# Patient Record
Sex: Female | Born: 1985 | Race: White | Hispanic: No | Marital: Married | Smoking: Never smoker
Health system: Southern US, Community
[De-identification: ages and names within clinical notes are randomized; demographics above are authoritative.]

## PROBLEM LIST (undated history)

## (undated) DIAGNOSIS — Q369 Cleft lip, unilateral: Secondary | ICD-10-CM

## (undated) DIAGNOSIS — Z6833 Body mass index (BMI) 33.0-33.9, adult: Secondary | ICD-10-CM

## (undated) DIAGNOSIS — I10 Essential (primary) hypertension: Secondary | ICD-10-CM

## (undated) DIAGNOSIS — O020 Blighted ovum and nonhydatidiform mole: Secondary | ICD-10-CM

## (undated) HISTORY — PX: TUBAL LIGATION: SHX77

## (undated) HISTORY — DX: Body mass index (BMI) 33.0-33.9, adult: Z68.33

## (undated) HISTORY — PX: CLEFT LIP REPAIR: SUR1164

## (undated) HISTORY — PX: WISDOM TOOTH EXTRACTION: SHX21

## (undated) HISTORY — DX: Blighted ovum and nonhydatidiform mole: O02.0

---
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
## 2007-11-26 ENCOUNTER — Other Ambulatory Visit: Admission: RE | Admit: 2007-11-26 | Discharge: 2007-11-26 | Payer: Self-pay | Admitting: Obstetrics and Gynecology

## 2009-02-21 HISTORY — PX: DILATION AND CURETTAGE OF UTERUS: SHX78

## 2009-03-27 ENCOUNTER — Ambulatory Visit (HOSPITAL_COMMUNITY): Admission: RE | Admit: 2009-03-27 | Discharge: 2009-03-27 | Payer: Self-pay | Admitting: Family Medicine

## 2010-01-21 ENCOUNTER — Ambulatory Visit (HOSPITAL_COMMUNITY)
Admission: RE | Admit: 2010-01-21 | Discharge: 2010-01-21 | Payer: Self-pay | Source: Home / Self Care | Admitting: Obstetrics and Gynecology

## 2010-05-04 LAB — CBC
HCT: 38.3 % (ref 36.0–46.0)
MCH: 28.1 pg (ref 26.0–34.0)
MCHC: 34.5 g/dL (ref 30.0–36.0)
MCV: 81.5 fL (ref 78.0–100.0)
Platelets: 286 10*3/uL (ref 150–400)
RDW: 14.1 % (ref 11.5–15.5)
WBC: 11.4 10*3/uL — ABNORMAL HIGH (ref 4.0–10.5)

## 2010-05-04 LAB — PROTIME-INR: INR: 0.89 (ref 0.00–1.49)

## 2010-05-05 ENCOUNTER — Other Ambulatory Visit: Payer: Self-pay | Admitting: Obstetrics and Gynecology

## 2010-05-05 ENCOUNTER — Other Ambulatory Visit (HOSPITAL_COMMUNITY)
Admission: RE | Admit: 2010-05-05 | Discharge: 2010-05-05 | Disposition: A | Discharge status: Home / Self Care | Payer: BC Managed Care – PPO | Source: Ambulatory Visit | Attending: Obstetrics and Gynecology | Admitting: Obstetrics and Gynecology

## 2010-05-05 DIAGNOSIS — Z01419 Encounter for gynecological examination (general) (routine) without abnormal findings: Secondary | ICD-10-CM | POA: Insufficient documentation

## 2010-08-31 LAB — HEPATITIS B SURFACE ANTIGEN: Hepatitis B Surface Ag: NEGATIVE

## 2010-08-31 LAB — ABO/RH: RH Type: POSITIVE

## 2010-08-31 LAB — RUBELLA ANTIBODY, IGM: Rubella: IMMUNE

## 2010-08-31 LAB — ANTIBODY SCREEN: Antibody Screen: NEGATIVE

## 2010-09-25 ENCOUNTER — Emergency Department (HOSPITAL_COMMUNITY)
Admission: EM | Admit: 2010-09-25 | Discharge: 2010-09-25 | Disposition: A | Discharge status: Home / Self Care | Payer: BC Managed Care – PPO | Attending: Emergency Medicine | Admitting: Emergency Medicine

## 2010-09-25 ENCOUNTER — Emergency Department (HOSPITAL_COMMUNITY): Payer: BC Managed Care – PPO

## 2010-09-25 ENCOUNTER — Encounter: Payer: Self-pay | Admitting: *Deleted

## 2010-09-25 DIAGNOSIS — O99891 Other specified diseases and conditions complicating pregnancy: Secondary | ICD-10-CM | POA: Insufficient documentation

## 2010-09-25 DIAGNOSIS — O26899 Other specified pregnancy related conditions, unspecified trimester: Secondary | ICD-10-CM

## 2010-09-25 DIAGNOSIS — R109 Unspecified abdominal pain: Secondary | ICD-10-CM | POA: Insufficient documentation

## 2010-09-25 LAB — URINALYSIS, ROUTINE W REFLEX MICROSCOPIC
Hgb urine dipstick: NEGATIVE
Nitrite: NEGATIVE
Urobilinogen, UA: 0.2 mg/dL (ref 0.0–1.0)

## 2010-09-25 NOTE — ED Notes (Signed)
Pt returned from [place]. Nad. No change.

## 2010-09-25 NOTE — ED Notes (Signed)
No change in status. Awaiting [place] tech.

## 2010-09-25 NOTE — ED Notes (Addendum)
Pt being transported to [place] at this time. nad

## 2010-09-25 NOTE — ED Notes (Signed)
edp in with pt now 

## 2010-09-25 NOTE — ED Notes (Signed)
Pt c/o right sided flank pain. Pt states it feels like a kidney infection.

## 2010-09-25 NOTE — ED Provider Notes (Signed)
History     CSN: [id number] Arrival date & time: 09/25/2010 10:09 AM  Chief Complaint  Patient presents with  . Flank Pain    pt c/o right sided flank pian   HPI Comments: Patient woke from sleep this morning, then developed pain in right flank radiating into the right lower abdomen.  States she has had slight burning with urination this week.  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    Patient is a 25 y.o. female presenting with flank pain. The history is provided by the patient.  Flank Pain This is a new problem. The current episode started 3 to 5 hours ago. The problem occurs constantly. The problem has not changed since onset.Pertinent negatives include no chest pain, no abdominal pain and no headaches. The symptoms are aggravated by nothing. The symptoms are relieved by nothing. She has tried nothing for the symptoms.    History reviewed. No pertinent past medical history.  Past Surgical History  Procedure Date  . Cleft lip repair   . Wisdom tooth extraction     History reviewed. No pertinent family history.  History  Substance Use Topics  . Smoking status: Never Smoker   . Smokeless tobacco: Never Used  . Alcohol Use: No    OB History    Grav Para Term Preterm Abortions TAB SAB Ect Mult Living   2    1           Review of Systems  Constitutional: Negative for activity change and appetite change.  Respiratory: Negative for cough.   Cardiovascular: Negative for chest pain and palpitations.  Gastrointestinal: Negative for nausea, vomiting, abdominal pain, diarrhea and constipation.  Genitourinary: Positive for dysuria and flank pain. Negative for hematuria, vaginal bleeding, vaginal discharge and vaginal pain.  Neurological: Negative for headaches.    Physical Exam  BP 145/91  Pulse 134  Temp(Src) 98.6 F (37 C) (Oral)  Resp 18  Ht 5\' 5"  (1.651 m)  Wt 189 lb (85.73 kg)  BMI 31.45 kg/m2  SpO2 100%  Physical Exam  Constitutional: She is oriented to  person, place, and time. She appears well-developed and well-nourished. No distress.  HENT:  Head: Normocephalic and atraumatic.  Neck: Normal range of motion. Neck supple.  Cardiovascular: Normal rate and regular rhythm.  Exam reveals no gallop and no friction rub.   No murmur heard. Pulmonary/Chest: Effort normal and breath sounds normal. No respiratory distress. She has no wheezes. She has no rales.  Abdominal: Soft.       Mild tenderness to palpation in the right flank and rlq.  There is no rebound or guarding.  Musculoskeletal: Normal range of motion.  Neurological: She is alert and oriented to person, place, and time.  Skin: Skin is warm and dry. She is not diaphoretic.    ED Course  Procedures  MDM Ultrasound looks okay.  No cause of pain identified.  ? Round ligament pain.  Will give time, return if worsens.      Geoffery Lyons, MD 09/25/10 [phone]

## 2011-02-22 NOTE — L&D Delivery Note (Signed)
Delivery Note  Pushed well to crowning.  At 10:40 AM a viable and healthy female was delivered via Vaginal, Spontaneous Delivery (Presentation: Right Occiput Anterior).  APGAR: 9, 9; weight 7 lb 3 oz (3260 g).   No difficulty with shoulders. There was a loose nuchal cord noted but baby delivered through it. Placenta status: Intact, Spontaneous.  Cord: 3 vessels with the following complications: None.  Cord pH: n/a  Bilateral periclitoral lacerations were noted which continued to bleed. Sutured with 40 vicryl rapide to hemostasis.   Anesthesia: Epidural  Episiotomy: None Lacerations:  Suture Repair: vicryl rapide 4.0 Est. Blood Loss (mL): 200  Mom to postpartum.  Baby to nursery-stable.  Wynelle Bourgeois 04/13/2011, 11:28 AM

## 2011-04-04 LAB — STREP B DNA PROBE: GBS: NEGATIVE

## 2011-04-12 ENCOUNTER — Encounter (HOSPITAL_COMMUNITY): Payer: Self-pay

## 2011-04-12 ENCOUNTER — Inpatient Hospital Stay (HOSPITAL_COMMUNITY)
Admission: RE | Admit: 2011-04-12 | Discharge: 2011-04-15 | DRG: 372 | Disposition: A | Discharge status: Home Health | Payer: BC Managed Care – PPO | Source: Ambulatory Visit | Attending: Obstetrics & Gynecology | Admitting: Obstetrics & Gynecology

## 2011-04-12 ENCOUNTER — Other Ambulatory Visit: Payer: Self-pay | Admitting: Obstetrics and Gynecology

## 2011-04-12 DIAGNOSIS — O139 Gestational [pregnancy-induced] hypertension without significant proteinuria, unspecified trimester: Secondary | ICD-10-CM | POA: Diagnosis present

## 2011-04-12 HISTORY — DX: Essential (primary) hypertension: I10

## 2011-04-12 LAB — COMPREHENSIVE METABOLIC PANEL
AST: 16 U/L (ref 0–37)
Albumin: 2.6 g/dL — ABNORMAL LOW (ref 3.5–5.2)
BUN: 7 mg/dL (ref 6–23)
Chloride: 104 mEq/L (ref 96–112)
Creatinine, Ser: 0.96 mg/dL (ref 0.50–1.10)
Potassium: 3.8 mEq/L (ref 3.5–5.1)
Total Bilirubin: 0.2 mg/dL — ABNORMAL LOW (ref 0.3–1.2)
Total Protein: 6.2 g/dL (ref 6.0–8.3)

## 2011-04-12 LAB — CBC
HCT: 37 % (ref 36.0–46.0)
MCHC: 32.7 g/dL (ref 30.0–36.0)
MCV: 79.7 fL (ref 78.0–100.0)
Platelets: 204 10*3/uL (ref 150–400)
RDW: 13.6 % (ref 11.5–15.5)
WBC: 11.6 10*3/uL — ABNORMAL HIGH (ref 4.0–10.5)

## 2011-04-12 LAB — GC/CHLAMYDIA PROBE AMP, GENITAL: Chlamydia: NEGATIVE

## 2011-04-12 MED ORDER — LACTATED RINGERS IV SOLN
500.0000 mL | INTRAVENOUS | Status: DC | PRN
  Administered 2011-04-13: 1000 mL via INTRAVENOUS

## 2011-04-12 MED ORDER — SODIUM CHLORIDE 0.9 % IV SOLN: 250.0000 mL | INTRAVENOUS | Status: DC | PRN

## 2011-04-12 MED ORDER — ACETAMINOPHEN 325 MG PO TABS: 650.0000 mg | ORAL_TABLET | ORAL | Status: DC | PRN

## 2011-04-12 MED ORDER — CITRIC ACID-SODIUM CITRATE 334-500 MG/5ML PO SOLN: 30.0000 mL | ORAL | Status: DC | PRN

## 2011-04-12 MED ORDER — MISOPROSTOL 25 MCG QUARTER TABLET
25.0000 ug | ORAL_TABLET | ORAL | Status: DC | PRN
  Administered 2011-04-12 – 2011-04-13 (×2): 25 ug via VAGINAL
  Filled 2011-04-12 (×2): qty 0.25

## 2011-04-12 MED ORDER — FLEET ENEMA 7-19 GM/118ML RE ENEM: 1.0000 | ENEMA | RECTAL | Status: DC | PRN

## 2011-04-12 MED ORDER — ONDANSETRON HCL 4 MG/2ML IJ SOLN: 4.0000 mg | Freq: Four times a day (QID) | INTRAMUSCULAR | Status: DC | PRN

## 2011-04-12 MED ORDER — LIDOCAINE HCL (PF) 1 % IJ SOLN
30.0000 mL | INTRAMUSCULAR | Status: DC | PRN
  Filled 2011-04-12: qty 30

## 2011-04-12 MED ORDER — IBUPROFEN 600 MG PO TABS: 600.0000 mg | ORAL_TABLET | Freq: Four times a day (QID) | ORAL | Status: DC | PRN

## 2011-04-12 MED ORDER — OXYCODONE-ACETAMINOPHEN 5-325 MG PO TABS: 1.0000 | ORAL_TABLET | ORAL | Status: DC | PRN

## 2011-04-12 MED ORDER — TERBUTALINE SULFATE 1 MG/ML IJ SOLN: 0.2500 mg | Freq: Once | INTRAMUSCULAR | Status: AC | PRN

## 2011-04-12 MED ORDER — SODIUM CHLORIDE 0.9 % IJ SOLN: 3.0000 mL | Freq: Two times a day (BID) | INTRAMUSCULAR | Status: DC

## 2011-04-12 MED ORDER — SODIUM CHLORIDE 0.9 % IJ SOLN
3.0000 mL | INTRAMUSCULAR | Status: DC | PRN
  Administered 2011-04-12: 3 mL via INTRAVENOUS

## 2011-04-12 NOTE — H&P (Signed)
  Destiny Pena is a 26 y.o. female presenting for induction of labor. She has been seen in the office this weight 37 weeks 5 days. On visit yesterday the patient had blood pressures 156/104, with 3 + reflexes, with PIH labs notable for normal liver function tests, platelets about 300 K., and 24 urine collection begun, returning today with 230 mg per day proteinuria, with blood pressures after 24 hours of bed rest remaining at 134/104 rechecked at 140/106 with reflexes unchanged at 3+ and patient reporting mild dull headache today scotoma right upper quadrant pain or bleeding she is admitted for induction of labor. Excised prenatal course followed at [facility] since July 10 at 5 weeks 5 days consensus [facility] 04/28/2011 with corresponding first trimester ultrasound x2. Prenatal labs include blood type O-positive antibody screen negative rubella immunity present hepatitis HIV RPR, GC, Chlamydia all negative integrated testing was performed and first trimester and was normal glucose tolerance test normal at 2 hour testing group B strep is negative as repeat GC and Chlamydia tests. He has attended childbirth classes breast-feed has a female infant desire circumcision as outpatient with undecided contraception plans History OB History    Grav Para Term Preterm Abortions TAB SAB Ect Mult Living   2    1          No past medical history on file. Past Surgical History  Procedure Date  . Cleft lip repair   . Wisdom tooth extraction    Family History: family history is not on file. Social History:  reports that she has never smoked. She has never used smokeless tobacco. She reports that she does not drink alcohol or use illicit drugs.  ROS positive for dull headache, negative for right upper quadrant pain scotoma   There were no vitals taken for this visit. vital signs blood pressure 134/104, recheck 1 4106 in the office today weight 201.8 pounds ExamPhysical Examination: General appearance -  alert, well appearing, and in no distress, oriented to person, place, and time and acyanotic, in no respiratory distress Mental status - alert, oriented to person, place, and time, anxious Mouth - mucous membranes moist, pharynx normal without lesions and dental hygiene good Chest - clear to auscultation, no wheezes, rales or rhonchi, symmetric air entry Heart - normal rate and regular rhythm Abdomen -  Gravid uterus 37 cm fundal height vertex presentation fetal heart tones 140 in the left lower quadrant Pelvic - normal external genitalia, vulva, vagina, cervix, uterus and adnexa, VULVA: normal appearing vulva with no masses, tenderness or lesions,            VAGINA: Secretions normal, CERVIX: normal appearing cervix without discharge or lesio cervix 1-2 cm dilation 25% effaced -2 station soft tissue vertex presentation Extremities - 2+ edema to pretibial area 3+ reflexes at knee,no clonus  Physical Exam  Prenatal labs: ABO, Rh:   O. positive Antibody:   negative Rubella:   immune RPR:   nonreactive HBsAg:   nonreactive HIV:   negative GBS:   negative Assessment/Plan: Gestational hypertension at 37 weeks 5 days admitted for induction of labor    Case presented to Dr. Macon Large. Admission scheduled for 7:30 PM 04/12/2011 May be a candidate for Cytotec ripening before Foley bulb,/ Pitocin induction   Samie Barclift V 04/12/2011, 7:16 PM

## 2011-04-13 ENCOUNTER — Encounter (HOSPITAL_COMMUNITY): Payer: Self-pay | Admitting: Anesthesiology

## 2011-04-13 ENCOUNTER — Inpatient Hospital Stay (HOSPITAL_COMMUNITY): Payer: BC Managed Care – PPO | Admitting: Anesthesiology

## 2011-04-13 ENCOUNTER — Encounter (HOSPITAL_COMMUNITY): Payer: Self-pay

## 2011-04-13 DIAGNOSIS — O139 Gestational [pregnancy-induced] hypertension without significant proteinuria, unspecified trimester: Secondary | ICD-10-CM

## 2011-04-13 LAB — RPR: RPR Ser Ql: NONREACTIVE

## 2011-04-13 LAB — CBC
MCH: 26 pg (ref 26.0–34.0)
MCV: 80.1 fL (ref 78.0–100.0)
Platelets: 191 10*3/uL (ref 150–400)
Platelets: 204 10*3/uL (ref 150–400)
RBC: 4.73 MIL/uL (ref 3.87–5.11)
RDW: 13.7 % (ref 11.5–15.5)
RDW: 13.7 % (ref 11.5–15.5)
WBC: 22.4 10*3/uL — ABNORMAL HIGH (ref 4.0–10.5)

## 2011-04-13 MED ORDER — EPHEDRINE 5 MG/ML INJ: 10.0000 mg | INTRAVENOUS | Status: DC | PRN

## 2011-04-13 MED ORDER — ONDANSETRON HCL 4 MG/2ML IJ SOLN: 4.0000 mg | Freq: Four times a day (QID) | INTRAMUSCULAR | Status: DC | PRN

## 2011-04-13 MED ORDER — FLEET ENEMA 7-19 GM/118ML RE ENEM: 1.0000 | ENEMA | RECTAL | Status: DC | PRN

## 2011-04-13 MED ORDER — LANOLIN HYDROUS EX OINT: TOPICAL_OINTMENT | CUTANEOUS | Status: DC | PRN

## 2011-04-13 MED ORDER — BENZOCAINE-MENTHOL 20-0.5 % EX AERO
INHALATION_SPRAY | CUTANEOUS | Status: AC
  Filled 2011-04-13: qty 56

## 2011-04-13 MED ORDER — IBUPROFEN 600 MG PO TABS: 600.0000 mg | ORAL_TABLET | Freq: Four times a day (QID) | ORAL | Status: DC | PRN

## 2011-04-13 MED ORDER — OXYCODONE-ACETAMINOPHEN 5-325 MG PO TABS: 1.0000 | ORAL_TABLET | ORAL | Status: DC | PRN

## 2011-04-13 MED ORDER — FENTANYL 2.5 MCG/ML BUPIVACAINE 1/10 % EPIDURAL INFUSION (WH - ANES)
14.0000 mL/h | INTRAMUSCULAR | Status: DC
  Administered 2011-04-13 (×2): 14 mL/h via EPIDURAL
  Filled 2011-04-13 (×2): qty 60

## 2011-04-13 MED ORDER — DIPHENHYDRAMINE HCL 50 MG/ML IJ SOLN: 12.5000 mg | INTRAMUSCULAR | Status: DC | PRN

## 2011-04-13 MED ORDER — WITCH HAZEL-GLYCERIN EX PADS
1.0000 "application " | MEDICATED_PAD | CUTANEOUS | Status: DC | PRN
  Administered 2011-04-13: 1 via TOPICAL

## 2011-04-13 MED ORDER — SIMETHICONE 80 MG PO CHEW: 80.0000 mg | CHEWABLE_TABLET | ORAL | Status: DC | PRN

## 2011-04-13 MED ORDER — LIDOCAINE HCL (PF) 1 % IJ SOLN
INTRAMUSCULAR | Status: DC | PRN
  Administered 2011-04-13 (×2): 5 mL

## 2011-04-13 MED ORDER — BENZOCAINE-MENTHOL 20-0.5 % EX AERO
1.0000 "application " | INHALATION_SPRAY | CUTANEOUS | Status: DC | PRN
  Administered 2011-04-13: 1 via TOPICAL

## 2011-04-13 MED ORDER — CITRIC ACID-SODIUM CITRATE 334-500 MG/5ML PO SOLN: 30.0000 mL | ORAL | Status: DC | PRN

## 2011-04-13 MED ORDER — IBUPROFEN 600 MG PO TABS
600.0000 mg | ORAL_TABLET | Freq: Four times a day (QID) | ORAL | Status: DC
  Administered 2011-04-13 – 2011-04-15 (×7): 600 mg via ORAL
  Filled 2011-04-13 (×7): qty 1

## 2011-04-13 MED ORDER — PHENYLEPHRINE 40 MCG/ML (10ML) SYRINGE FOR IV PUSH (FOR BLOOD PRESSURE SUPPORT): 80.0000 ug | PREFILLED_SYRINGE | INTRAVENOUS | Status: DC | PRN

## 2011-04-13 MED ORDER — ONDANSETRON HCL 4 MG/2ML IJ SOLN: 4.0000 mg | INTRAMUSCULAR | Status: DC | PRN

## 2011-04-13 MED ORDER — TETANUS-DIPHTH-ACELL PERTUSSIS 5-2.5-18.5 LF-MCG/0.5 IM SUSP
0.5000 mL | Freq: Once | INTRAMUSCULAR | Status: AC
  Administered 2011-04-14: 0.5 mL via INTRAMUSCULAR
  Filled 2011-04-13: qty 0.5

## 2011-04-13 MED ORDER — EPHEDRINE 5 MG/ML INJ
10.0000 mg | INTRAVENOUS | Status: DC | PRN
  Filled 2011-04-13: qty 4

## 2011-04-13 MED ORDER — ZOLPIDEM TARTRATE 5 MG PO TABS: 5.0000 mg | ORAL_TABLET | Freq: Every evening | ORAL | Status: DC | PRN

## 2011-04-13 MED ORDER — SENNOSIDES-DOCUSATE SODIUM 8.6-50 MG PO TABS
2.0000 | ORAL_TABLET | Freq: Every day | ORAL | Status: DC
  Administered 2011-04-13: 2 via ORAL

## 2011-04-13 MED ORDER — LACTATED RINGERS IV SOLN
500.0000 mL | Freq: Once | INTRAVENOUS | Status: AC
  Administered 2011-04-13: 1000 mL via INTRAVENOUS

## 2011-04-13 MED ORDER — OXYTOCIN 20 UNITS IN LACTATED RINGERS INFUSION - SIMPLE: 125.0000 mL/h | Freq: Once | INTRAVENOUS | Status: DC

## 2011-04-13 MED ORDER — LIDOCAINE HCL (PF) 1 % IJ SOLN: 30.0000 mL | INTRAMUSCULAR | Status: DC | PRN

## 2011-04-13 MED ORDER — PHENYLEPHRINE 40 MCG/ML (10ML) SYRINGE FOR IV PUSH (FOR BLOOD PRESSURE SUPPORT)
80.0000 ug | PREFILLED_SYRINGE | INTRAVENOUS | Status: DC | PRN
  Filled 2011-04-13: qty 5

## 2011-04-13 MED ORDER — ONDANSETRON HCL 4 MG PO TABS: 4.0000 mg | ORAL_TABLET | ORAL | Status: DC | PRN

## 2011-04-13 MED ORDER — LACTATED RINGERS IV SOLN: 500.0000 mL | INTRAVENOUS | Status: DC | PRN

## 2011-04-13 MED ORDER — DIPHENHYDRAMINE HCL 25 MG PO CAPS: 25.0000 mg | ORAL_CAPSULE | Freq: Four times a day (QID) | ORAL | Status: DC | PRN

## 2011-04-13 MED ORDER — OXYTOCIN BOLUS FROM INFUSION
500.0000 mL | Freq: Once | INTRAVENOUS | Status: DC
  Administered 2011-04-13: 500 mL via INTRAVENOUS
  Filled 2011-04-13: qty 1000
  Filled 2011-04-13: qty 500

## 2011-04-13 MED ORDER — NALBUPHINE SYRINGE 5 MG/0.5 ML
10.0000 mg | INJECTION | INTRAMUSCULAR | Status: DC | PRN
  Administered 2011-04-13: 10 mg via INTRAVENOUS
  Filled 2011-04-13: qty 0.5
  Filled 2011-04-13: qty 1

## 2011-04-13 MED ORDER — PRENATAL MULTIVITAMIN CH
1.0000 | ORAL_TABLET | Freq: Every day | ORAL | Status: DC
  Administered 2011-04-14 – 2011-04-15 (×2): 1 via ORAL
  Filled 2011-04-13 (×2): qty 1

## 2011-04-13 MED ORDER — LACTATED RINGERS IV SOLN
INTRAVENOUS | Status: DC
  Administered 2011-04-13: 08:00:00 via INTRAVENOUS

## 2011-04-13 MED ORDER — ACETAMINOPHEN 325 MG PO TABS: 650.0000 mg | ORAL_TABLET | ORAL | Status: DC | PRN

## 2011-04-13 MED ORDER — NALBUPHINE SYRINGE 5 MG/0.5 ML: 10.0000 mg | INJECTION | INTRAMUSCULAR | Status: DC | PRN

## 2011-04-13 MED ORDER — DIBUCAINE 1 % RE OINT: 1.0000 "application " | TOPICAL_OINTMENT | RECTAL | Status: DC | PRN

## 2011-04-13 NOTE — Progress Notes (Signed)
CNM at bedside, reviewed strip.

## 2011-04-13 NOTE — Progress Notes (Signed)
Late Entry for 0830:  FHR reassuring. UCs every 2-3 minutes. Cervix complete, patient pushing with good effort. Comfortable with epidural.

## 2011-04-13 NOTE — Progress Notes (Signed)
Destiny Pena is a 26 y.o. G2P0010 at [redacted]w[redacted]d   Subjective: Receiving epid now; s/p cytotec x 2 doses  Objective: BP 139/98  Pulse 68  Temp(Src) 97.5 F (36.4 C) (Oral)  Resp 18  Ht 5\' 4"  (1.626 m)  Wt 91.173 kg (201 lb)  BMI 34.50 kg/m2  LMP 07/22/2010      FHT:  FHR: 120 bpm, variability: moderate,  accelerations:  Present,  decelerations:  Absent UC:   regular, every 2-3 minutes SVE:   Dilation: 6 Effacement (%): 100 Station: +1 Exam by:: Ace Gins, RN  Labs: Lab Results  Component Value Date   WBC 14.8* 04/13/2011   HGB 12.3 04/13/2011   HCT 37.9 04/13/2011   MCV 80.1 04/13/2011   PLT 191 04/13/2011    Assessment / Plan: Active labor GHTN- stable for now  Will reeval cx in approx 2 hours or sooner prn; no Pitocin needed for now    Cam Hai 04/13/2011, 6:33 AM

## 2011-04-13 NOTE — Anesthesia Procedure Notes (Signed)
Epidural Patient location during procedure: OB Start time: 04/13/2011 6:29 AM  Staffing Anesthesiologist: Brayton Caves R Performed by: anesthesiologist   Preanesthetic Checklist Completed: patient identified, site marked, surgical consent, pre-op evaluation, timeout performed, IV checked, risks and benefits discussed and monitors and equipment checked  Epidural Patient position: sitting Prep: site prepped and draped and DuraPrep Patient monitoring: continuous pulse ox and blood pressure Approach: midline Injection technique: LOR air and LOR saline  Needle:  Needle type: Tuohy  Needle gauge: 17 G Needle length: 9 cm Needle insertion depth: 6 cm Catheter type: closed end flexible Catheter size: 19 Gauge Catheter at skin depth: 11 cm Test dose: negative  Assessment Events: blood not aspirated, injection not painful, no injection resistance, negative IV test and no paresthesia  Additional Notes Patient identified.  Risk benefits discussed including failed block, incomplete pain control, headache, nerve damage, paralysis, blood pressure changes, nausea, vomiting, reactions to medication both toxic or allergic, and postpartum back pain.  Patient expressed understanding and wished to proceed.  All questions were answered.  Sterile technique used throughout procedure and epidural site dressed with sterile barrier dressing. No paresthesia or other complications noted.The patient did not experience any signs of intravascular injection such as tinnitus or metallic taste in mouth nor signs of intrathecal spread such as rapid motor block. Please see nursing notes for vital signs.

## 2011-04-13 NOTE — Anesthesia Preprocedure Evaluation (Signed)
Anesthesia Evaluation  Patient identified by MRN, date of birth, ID band Patient awake    Reviewed: Allergy & Precautions, H&P , Patient's Chart, lab work & pertinent test results  Airway Mallampati: II TM Distance: >3 FB Neck ROM: full    Dental No notable dental hx.    Pulmonary neg pulmonary ROS,  clear to auscultation  Pulmonary exam normal       Cardiovascular hypertension, neg cardio ROS regular Normal    Neuro/Psych Negative Neurological ROS  Negative Psych ROS   GI/Hepatic negative GI ROS, Neg liver ROS,   Endo/Other  Negative Endocrine ROS  Renal/GU negative Renal ROS     Musculoskeletal   Abdominal   Peds  Hematology negative hematology ROS (+)   Anesthesia Other Findings   Reproductive/Obstetrics (+) Pregnancy                           Anesthesia Physical Anesthesia Plan  ASA: III  Anesthesia Plan: Epidural   Post-op Pain Management:    Induction:   Airway Management Planned:   Additional Equipment:   Intra-op Plan:   Post-operative Plan:   Informed Consent: I have reviewed the patients History and Physical, chart, labs and discussed the procedure including the risks, benefits and alternatives for the proposed anesthesia with the patient or authorized representative who has indicated his/her understanding and acceptance.     Plan Discussed with:   Anesthesia Plan Comments:         Anesthesia Quick Evaluation  

## 2011-04-14 MED ORDER — HYDROCHLOROTHIAZIDE 25 MG PO TABS
25.0000 mg | ORAL_TABLET | Freq: Every day | ORAL | Status: DC
  Administered 2011-04-14 – 2011-04-15 (×2): 25 mg via ORAL
  Filled 2011-04-14 (×2): qty 1

## 2011-04-14 NOTE — Anesthesia Postprocedure Evaluation (Signed)
Anesthesia Post Note  Patient: Destiny Pena  Procedure(s) Performed: * No procedures listed *  Anesthesia type: Spinal  Patient location: Mother/Baby  Post pain: Pain level controlled  Post assessment: Post-op Vital signs reviewed  Last Vitals:  Filed Vitals:   04/14/11 1608  BP: 134/90  Pulse: 92  Temp:   Resp: 18    Post vital signs: Reviewed  Level of consciousness: awake  Complications: No apparent anesthesia complications

## 2011-04-14 NOTE — Progress Notes (Signed)
Post Partum Day 1 Subjective: no complaints, up ad lib, voiding and tolerating PO, small lochia, plans to breastfeed, oral progesterone-only contraceptive  Objective: Blood pressure 128/79, pulse 88, temperature 97.6 F (36.4 C), temperature source Oral, resp. rate 18, height 5\' 4"  (1.626 m), weight 201 lb (91.173 kg), last menstrual period 07/22/2010, SpO2 97.00%, unknown if currently breastfeeding.  Breastfeeding well  Physical Exam:  General: alert, cooperative and no distress Lochia:normal flow Chest: CTAB Heart: RRR no m/r/g Abdomen: +BS, soft, nontender,  Uterine Fundus: firm DVT Evaluation: No evidence of DVT seen on physical exam. Extremities: 1+ edema   Basename 04/13/11 1230 04/13/11 0555  HGB 11.6* 12.3  HCT 35.8* 37.9    Assessment/Plan: Plan for discharge tomorrow, Breastfeeding and Lactation consult   LOS: 2 days   Pena,Destiny Dowler 04/14/2011, 7:33 AM

## 2011-04-14 NOTE — Progress Notes (Signed)
[facility] CNM Called with,  Patients V/s and  High B/P readings. Patient denies any symptoms. See Assessment. CNM will round on patient this afternoon and would like hourly B/P taken until she sees patient today 04/13/2010 1446.

## 2011-04-15 MED ORDER — IBUPROFEN 600 MG PO TABS: 600.0000 mg | ORAL_TABLET | Freq: Four times a day (QID) | ORAL | Status: AC

## 2011-04-15 NOTE — Discharge Summary (Signed)
Obstetric Discharge Summary Reason for Admission: onset of labor Prenatal Procedures: none Intrapartum Procedures: spontaneous vaginal delivery Postpartum Procedures: none Complications-Operative and Postpartum: 1st degree periclitoral laceration Hemoglobin  Date Value Range Status  04/13/2011 11.6* 12.0-15.0 (g/dL) Final     HCT  Date Value Range Status  04/13/2011 35.8* 36.0-46.0 (%) Final    Discharge Diagnoses: Term Pregnancy-delivered  Discharge Information: Date: 04/15/2011 Activity: pelvic rest Diet: routine Medications: PNV and Ibuprofen Condition: stable Instructions: refer to practice specific booklet Discharge to: home Follow-up Information    Follow up with [facility] in 6 weeks.         Newborn Data: Live born female  Birth Weight: 7 lb 3 oz (3260 g) APGAR: 9, 9  Home with mother.  [name] 04/15/2011, 11:26 AM

## 2011-04-15 NOTE — Progress Notes (Signed)
Post Partum Day #2 Subjective: up ad lib, voiding, tolerating PO and appropriate lochia. Patient states she has had some difficulty controlling her urine. She does not feel the urge to go and has had some incontinence.  Also, BP elevated overnight. Highest reading 160/94. Patient is on HCTZ. Breast feeding is going well. Baby boy scheduled for circ today. She will follow up with [facility]. Interested in [facility] for contraception.  Objective: Blood pressure 140/97, pulse 93, temperature 98.1 F (36.7 C), temperature source Oral, resp. rate 18, height 5\' 4"  (1.626 m), weight 91.173 kg (201 lb), last menstrual period 07/22/2010, SpO2 98.00%, unknown if currently breastfeeding.  Physical Exam:  General: alert, cooperative and no distress Lochia: appropriate Uterine Fundus: firm DVT Evaluation: No evidence of DVT seen on physical exam.   Basename 04/13/11 1230 04/13/11 0555  HGB 11.6* 12.3  HCT 35.8* 37.9    Assessment/Plan: Discharge home, Breastfeeding, Circumcision prior to discharge and Contraception OCP   LOS: 3 days   Destiny Pena 04/15/2011, 6:56 AM

## 2011-04-15 NOTE — Discharge Instructions (Signed)
Vaginal Delivery °Care After °· Change your pad on each trip to the bathroom.  °· Wipe gently with toilet paper during your hospital stay. Always wipe from front to back. A spray bottle with warm tap water could also be used or a towelette if available.  °· Place your soiled pad and toilet paper in a bathroom wastebasket with a plastic bag liner.  °· During your hospital stay, save any clots. If you pass a clot while on the toilet, do not flush it. Also, if your vaginal flow seems excessive to you, notify nursing personnel.  °· The first time you get out of bed after delivery, wait for assistance from a nurse. Do not get up alone at any time if you feel weak or dizzy.  °· Bend and extend your ankles forcefully so that you feel the calves of your legs get hard. Do this 6 times every hour when you are in bed and awake.  °· Do not sit with one foot under you, dangle your legs over the edge of the bed, or maintain a position that hinders the circulation in your legs.  °· Many women experience after pains for 2 to 3 days after delivery. These after pains are mild uterine contractions. Ask the nurse for a pain medication if you need something for this. Sometimes breastfeeding stimulates after pains; if you find this to be true, ask for the medication ½ - ¾ hour before the next feeding.  °· For you and your infant's protection, do not go beyond the door(s) of the obstetric unit. Do not carry your baby in your arms in the hallway. When taking your baby to and from your room, put your baby in the bassinet and push the bassinet.  °· Mothers may have their babies in their room as much as they desire.  °Document Released: 02/05/2000 Document Revised: 10/20/2010 Document Reviewed: 01/05/2007 °ExitCare® Patient Information ©2012 [employer]. °

## 2011-04-15 NOTE — Progress Notes (Signed)
Patient ID: Destiny Pena, female   DOB: May 19, 1985, 26 y.o.   MRN: [id number]  I discussed with the patient regarding the risks and benefits of having a circumcision done for her newborn son, including poor cosmetic result, injury to glans or urethra, reaction to medication, bleeding.  Questions answered.  Consent signed and on the chart.  Candelaria Celeste JEHIEL 04/15/2011 9:59 AM

## 2011-04-18 ENCOUNTER — Encounter (HOSPITAL_COMMUNITY): Payer: Self-pay

## 2011-04-28 ENCOUNTER — Inpatient Hospital Stay (HOSPITAL_COMMUNITY): Admission: AD | Admit: 2011-04-28 | Payer: Self-pay | Source: Ambulatory Visit | Admitting: Obstetrics and Gynecology

## 2012-04-09 ENCOUNTER — Other Ambulatory Visit: Payer: Self-pay | Admitting: Obstetrics and Gynecology

## 2012-04-09 ENCOUNTER — Encounter: Payer: Self-pay | Admitting: Obstetrics and Gynecology

## 2012-04-09 ENCOUNTER — Ambulatory Visit: Payer: Self-pay | Admitting: Obstetrics and Gynecology

## 2012-04-09 VITALS — BP 124/88 | HR 86 | Ht 64.0 in | Wt 183.0 lb

## 2012-04-09 DIAGNOSIS — G43829 Menstrual migraine, not intractable, without status migrainosus: Secondary | ICD-10-CM

## 2012-04-09 DIAGNOSIS — Z87442 Personal history of urinary calculi: Secondary | ICD-10-CM | POA: Insufficient documentation

## 2012-04-09 DIAGNOSIS — Z8742 Personal history of other diseases of the female genital tract: Secondary | ICD-10-CM | POA: Insufficient documentation

## 2012-04-09 DIAGNOSIS — Z01419 Encounter for gynecological examination (general) (routine) without abnormal findings: Secondary | ICD-10-CM

## 2012-04-09 MED ORDER — NORGESTIM-ETH ESTRAD TRIPHASIC 0.18/0.215/0.25 MG-35 MCG PO TABS: 1.0000 | ORAL_TABLET | Freq: Every day | ORAL | Status: DC

## 2012-04-09 NOTE — Progress Notes (Signed)
Subjective:  AEX  Last Pap: 04/2010 WNL: Yes Regular Periods:yes Contraception: pill-trinessa   Monthly Breast exam:yes Tetanus<4yrs:yes Nl.Bladder Function:yes Daily BMs:yes Healthy Diet:yes Calcium:no Mammogram:no Date of Mammogram: n/a Exercise: occasional  Seatbelt: yes Abuse at home: no Stressful work:yes sometimes Sigmoid-colonoscopy: n/a Bone Density: No PCP: Dr. Sherwood Gambler at [facility]  Change in PMH: n/a  Change in FMH:n/a   Destiny Pena is a 27 y.o. female, G2P1011, who presents for an annual exam. C/O headache which occurs on day she starts her new pack pills and resolves in less than 24 hrs  History   Social History  . Marital Status: Married    Spouse Name: N/A    Number of Children: N/A  . Years of Education: N/A   Social History Main Topics  . Smoking status: Never Smoker   . Smokeless tobacco: Never Used  . Alcohol Use: Yes     Comment: occasionally   . Drug Use: No  . Sexually Active: Yes    Birth Control/ Protection: Pill   Other Topics Concern  . None   Social History Narrative  . None    Menstrual cycle:   LMP: Patient's last menstrual period was 03/29/2012.           Cycle: Now regular withdrawal for 4 days of light bleeding on BCPs since age 33 except with pregnancyies  Prior to [facility] was regular but heavy and painful for 4 days with a total of 7 days bleed  The following portions of the patient's history were reviewed and updated as appropriate: allergies, current medications, past family history, past medical history, past social history, past surgical history and problem list.  Review of Systems Pertinent items are noted in HPI. Breast:Negative for breast lump,nipple discharge or nipple retraction Gastrointestinal: Negative for abdominal pain, change in bowel habits or rectal bleeding Urinary:negative   Objective:    BP 124/88  Pulse 86  Ht 5\' 4"  (1.626 m)  Wt 183 lb (83.008 kg)  BMI 31.4 kg/m2  LMP 03/29/2012   Breastfeeding? No    Weight:  Wt Readings from Last 1 Encounters:  04/09/12 183 lb (83.008 kg)          BMI: Body mass index is 31.4 kg/(m^2).  General Appearance: Alert, appropriate appearance for age. No acute distress HEENT: Grossly normal Neck / Thyroid: Supple, no masses, nodes or enlargement Lungs: clear to auscultation bilaterally Back: No CVA tenderness Breast Exam: No masses or nodes.No dimpling, nipple retraction or discharge. Cardiovascular: Regular rate and rhythm. S1, S2, no murmur Gastrointestinal: Soft, non-tender, no masses or organomegaly Pelvic Exam: Vulva and vagina appear normal. Bimanual exam reveals normal uterus and adnexa. Rectovaginal: normal rectal, no masses Lymphatic Exam: Non-palpable nodes in neck, clavicular, axillary, or inguinal regions Skin: no rash or abnormalities Neurologic: Normal gait and speech, no tremor  Psychiatric: Alert and oriented, appropriate affect.   Wet Prep:not applicable Urinalysis:not applicable UPT: Not done   Assessment:    Normal gyn exam Hx dysmenorrhea and menorrhagia well managed with BCPs  Family hx thyroid disease Premenstrual headaches   Plan:     return annually or prn Contraception:oral contraceptives (estrogen/progesterone)Wants to continue current pills.  Declines change to address headachesz Pap and TSH to [place] [facility]   Dierdre Forth MD

## 2012-11-22 ENCOUNTER — Encounter (HOSPITAL_COMMUNITY): Payer: Self-pay | Admitting: Anesthesiology

## 2012-11-22 ENCOUNTER — Observation Stay (HOSPITAL_COMMUNITY)
Admission: EM | Admit: 2012-11-22 | Discharge: 2012-11-23 | Disposition: A | Discharge status: Home / Self Care | Payer: 59 | Attending: Surgery | Admitting: Surgery

## 2012-11-22 ENCOUNTER — Encounter (HOSPITAL_COMMUNITY)
Admission: EM | Disposition: A | Discharge status: Home / Self Care | Payer: Self-pay | Source: Home / Self Care | Attending: Emergency Medicine

## 2012-11-22 ENCOUNTER — Emergency Department (HOSPITAL_COMMUNITY): Payer: 59

## 2012-11-22 ENCOUNTER — Encounter (HOSPITAL_COMMUNITY): Payer: Self-pay | Admitting: *Deleted

## 2012-11-22 ENCOUNTER — Observation Stay (HOSPITAL_COMMUNITY): Payer: 59

## 2012-11-22 ENCOUNTER — Observation Stay (HOSPITAL_COMMUNITY): Payer: 59 | Admitting: Anesthesiology

## 2012-11-22 DIAGNOSIS — K8010 Calculus of gallbladder with chronic cholecystitis without obstruction: Secondary | ICD-10-CM

## 2012-11-22 DIAGNOSIS — K8000 Calculus of gallbladder with acute cholecystitis without obstruction: Principal | ICD-10-CM | POA: Insufficient documentation

## 2012-11-22 DIAGNOSIS — K8020 Calculus of gallbladder without cholecystitis without obstruction: Secondary | ICD-10-CM

## 2012-11-22 DIAGNOSIS — D72829 Elevated white blood cell count, unspecified: Secondary | ICD-10-CM | POA: Insufficient documentation

## 2012-11-22 DIAGNOSIS — R1011 Right upper quadrant pain: Secondary | ICD-10-CM

## 2012-11-22 DIAGNOSIS — K8050 Calculus of bile duct without cholangitis or cholecystitis without obstruction: Secondary | ICD-10-CM

## 2012-11-22 HISTORY — PX: CHOLECYSTECTOMY: SHX55

## 2012-11-22 LAB — URINALYSIS, ROUTINE W REFLEX MICROSCOPIC
Bilirubin Urine: NEGATIVE
Glucose, UA: NEGATIVE mg/dL
Ketones, ur: 15 mg/dL — AB
Nitrite: NEGATIVE
Protein, ur: NEGATIVE mg/dL
Specific Gravity, Urine: 1.009 (ref 1.005–1.030)
Urobilinogen, UA: 0.2 mg/dL (ref 0.0–1.0)

## 2012-11-22 LAB — COMPREHENSIVE METABOLIC PANEL
ALT: 14 U/L (ref 0–35)
AST: 15 U/L (ref 0–37)
Alkaline Phosphatase: 53 U/L (ref 39–117)
CO2: 22 mEq/L (ref 19–32)
Calcium: 9.1 mg/dL (ref 8.4–10.5)
Chloride: 100 mEq/L (ref 96–112)
Creatinine, Ser: 0.72 mg/dL (ref 0.50–1.10)
GFR calc Af Amer: >90 mL/min (ref >90)
GFR calc non Af Amer: >90 mL/min (ref >90)
Glucose, Bld: 84 mg/dL (ref 70–99)
Sodium: 135 mEq/L (ref 135–145)
Total Bilirubin: 0.2 mg/dL — ABNORMAL LOW (ref 0.3–1.2)

## 2012-11-22 LAB — CBC WITH DIFFERENTIAL/PLATELET
Basophils Relative: 0 % (ref 0–1)
Hemoglobin: 13.3 g/dL (ref 12.0–15.0)
Lymphocytes Relative: 20 % (ref 12–46)
Lymphs Abs: 2.5 10*3/uL (ref 0.7–4.0)
Monocytes Relative: 5 % (ref 3–12)
Neutro Abs: 9.3 10*3/uL — ABNORMAL HIGH (ref 1.7–7.7)
Neutrophils Relative %: 75 % (ref 43–77)
RBC: 4.81 MIL/uL (ref 3.87–5.11)
WBC: 12.4 10*3/uL — ABNORMAL HIGH (ref 4.0–10.5)

## 2012-11-22 LAB — URINE MICROSCOPIC-ADD ON

## 2012-11-22 LAB — LIPASE, BLOOD: Lipase: 23 U/L (ref 11–59)

## 2012-11-22 SURGERY — LAPAROSCOPIC CHOLECYSTECTOMY WITH INTRAOPERATIVE CHOLANGIOGRAM
Anesthesia: General | Site: Abdomen | Wound class: Clean Contaminated

## 2012-11-22 MED ORDER — ACETAMINOPHEN 650 MG RE SUPP: 650.0000 mg | Freq: Four times a day (QID) | RECTAL | Status: DC | PRN

## 2012-11-22 MED ORDER — SODIUM CHLORIDE 0.9 % IV SOLN
3.0000 g | Freq: Four times a day (QID) | INTRAVENOUS | Status: DC
  Administered 2012-11-22 (×2): 3 g via INTRAVENOUS
  Filled 2012-11-22 (×5): qty 3

## 2012-11-22 MED ORDER — DIPHENHYDRAMINE HCL 12.5 MG/5ML PO ELIX: 12.5000 mg | ORAL_SOLUTION | Freq: Four times a day (QID) | ORAL | Status: DC | PRN

## 2012-11-22 MED ORDER — PROMETHAZINE HCL 25 MG/ML IJ SOLN: 6.2500 mg | INTRAMUSCULAR | Status: DC | PRN

## 2012-11-22 MED ORDER — LIDOCAINE HCL (CARDIAC) 20 MG/ML IV SOLN
INTRAVENOUS | Status: DC | PRN
  Administered 2012-11-22: 80 mg via INTRAVENOUS

## 2012-11-22 MED ORDER — NEOSTIGMINE METHYLSULFATE 1 MG/ML IJ SOLN
INTRAMUSCULAR | Status: DC | PRN
  Administered 2012-11-22: 4 mg via INTRAVENOUS

## 2012-11-22 MED ORDER — ARTIFICIAL TEARS OP OINT
TOPICAL_OINTMENT | OPHTHALMIC | Status: DC | PRN
  Administered 2012-11-22: 1 via OPHTHALMIC

## 2012-11-22 MED ORDER — FAMOTIDINE 20 MG PO TABS
20.0000 mg | ORAL_TABLET | Freq: Every day | ORAL | Status: DC
  Administered 2012-11-23: 20 mg via ORAL
  Filled 2012-11-22: qty 1

## 2012-11-22 MED ORDER — 0.9 % SODIUM CHLORIDE (POUR BTL) OPTIME
TOPICAL | Status: DC | PRN
  Administered 2012-11-22: 1000 mL

## 2012-11-22 MED ORDER — SODIUM CHLORIDE 0.9 % IV SOLN
INTRAVENOUS | Status: DC | PRN
  Administered 2012-11-22: 15:00:00

## 2012-11-22 MED ORDER — LABETALOL HCL 5 MG/ML IV SOLN
INTRAVENOUS | Status: DC | PRN
  Administered 2012-11-22 (×2): 5 mg via INTRAVENOUS

## 2012-11-22 MED ORDER — KCL-LACTATED RINGERS-D5W 20 MEQ/L IV SOLN
INTRAVENOUS | Status: DC
  Administered 2012-11-23: 02:00:00 via INTRAVENOUS
  Filled 2012-11-22 (×3): qty 1000

## 2012-11-22 MED ORDER — DIPHENHYDRAMINE HCL 50 MG/ML IJ SOLN: 12.5000 mg | Freq: Four times a day (QID) | INTRAMUSCULAR | Status: DC | PRN

## 2012-11-22 MED ORDER — HYDROMORPHONE HCL PF 1 MG/ML IJ SOLN
0.5000 mg | INTRAMUSCULAR | Status: DC | PRN
  Administered 2012-11-22: 1 mg via INTRAVENOUS
  Filled 2012-11-22: qty 1

## 2012-11-22 MED ORDER — HYDROMORPHONE HCL PF 1 MG/ML IJ SOLN
0.2500 mg | INTRAMUSCULAR | Status: DC | PRN
  Administered 2012-11-22: 0.25 mg via INTRAVENOUS
  Administered 2012-11-22: 0.5 mg via INTRAVENOUS
  Administered 2012-11-22: 0.25 mg via INTRAVENOUS
  Administered 2012-11-22: 0.5 mg via INTRAVENOUS

## 2012-11-22 MED ORDER — POTASSIUM CHLORIDE IN NACL 20-0.9 MEQ/L-% IV SOLN
INTRAVENOUS | Status: DC
  Administered 2012-11-22: 14:00:00 via INTRAVENOUS
  Filled 2012-11-22 (×4): qty 1000

## 2012-11-22 MED ORDER — HYDROMORPHONE HCL PF 1 MG/ML IJ SOLN
1.0000 mg | Freq: Once | INTRAMUSCULAR | Status: AC
  Administered 2012-11-22: 1 mg via INTRAVENOUS
  Filled 2012-11-22: qty 1

## 2012-11-22 MED ORDER — PROPOFOL 10 MG/ML IV BOLUS
INTRAVENOUS | Status: DC | PRN
  Administered 2012-11-22: 170 mg via INTRAVENOUS

## 2012-11-22 MED ORDER — HYDROCODONE-ACETAMINOPHEN 5-325 MG PO TABS: 1.0000 | ORAL_TABLET | ORAL | Status: DC | PRN

## 2012-11-22 MED ORDER — SODIUM CHLORIDE 0.9 % IR SOLN
Status: DC | PRN
  Administered 2012-11-22: 1000 mL

## 2012-11-22 MED ORDER — GLYCOPYRROLATE 0.2 MG/ML IJ SOLN
INTRAMUSCULAR | Status: DC | PRN
  Administered 2012-11-22: 0.6 mg via INTRAVENOUS

## 2012-11-22 MED ORDER — MIDAZOLAM HCL 5 MG/5ML IJ SOLN
INTRAMUSCULAR | Status: DC | PRN
  Administered 2012-11-22: 2 mg via INTRAVENOUS

## 2012-11-22 MED ORDER — DEXAMETHASONE SODIUM PHOSPHATE 4 MG/ML IJ SOLN
INTRAMUSCULAR | Status: DC | PRN
  Administered 2012-11-22: 4 mg via INTRAVENOUS

## 2012-11-22 MED ORDER — BUPIVACAINE-EPINEPHRINE 0.25% -1:200000 IJ SOLN
INTRAMUSCULAR | Status: DC | PRN
  Administered 2012-11-22: 15 mL

## 2012-11-22 MED ORDER — BUPIVACAINE-EPINEPHRINE PF 0.25-1:200000 % IJ SOLN
INTRAMUSCULAR | Status: AC
  Filled 2012-11-22: qty 30

## 2012-11-22 MED ORDER — OXYCODONE HCL 5 MG/5ML PO SOLN: 5.0000 mg | Freq: Once | ORAL | Status: DC | PRN

## 2012-11-22 MED ORDER — LACTATED RINGERS IV SOLN
INTRAVENOUS | Status: DC | PRN
  Administered 2012-11-22: 15:00:00 via INTRAVENOUS

## 2012-11-22 MED ORDER — SODIUM CHLORIDE 0.9 % IV SOLN
3.0000 g | Freq: Four times a day (QID) | INTRAVENOUS | Status: AC
Start: 2012-11-22 — End: 2012-11-23
  Administered 2012-11-23 (×2): 3 g via INTRAVENOUS
  Filled 2012-11-22 (×3): qty 3

## 2012-11-22 MED ORDER — ONDANSETRON HCL 4 MG/2ML IJ SOLN
INTRAMUSCULAR | Status: DC | PRN
  Administered 2012-11-22: 4 mg via INTRAVENOUS

## 2012-11-22 MED ORDER — ACETAMINOPHEN 325 MG PO TABS: 650.0000 mg | ORAL_TABLET | Freq: Four times a day (QID) | ORAL | Status: DC | PRN

## 2012-11-22 MED ORDER — FENTANYL CITRATE 0.05 MG/ML IJ SOLN
INTRAMUSCULAR | Status: DC | PRN
  Administered 2012-11-22: 50 ug via INTRAVENOUS
  Administered 2012-11-22: 100 ug via INTRAVENOUS
  Administered 2012-11-22 (×2): 50 ug via INTRAVENOUS

## 2012-11-22 MED ORDER — ROCURONIUM BROMIDE 100 MG/10ML IV SOLN
INTRAVENOUS | Status: DC | PRN
  Administered 2012-11-22: 50 mg via INTRAVENOUS

## 2012-11-22 MED ORDER — HYDROMORPHONE HCL PF 1 MG/ML IJ SOLN
INTRAMUSCULAR | Status: AC
  Filled 2012-11-22: qty 1

## 2012-11-22 MED ORDER — ONDANSETRON HCL 4 MG/2ML IJ SOLN
4.0000 mg | Freq: Four times a day (QID) | INTRAMUSCULAR | Status: DC | PRN
  Administered 2012-11-22: 4 mg via INTRAVENOUS
  Filled 2012-11-22: qty 2

## 2012-11-22 MED ORDER — OXYCODONE HCL 5 MG PO TABS: 5.0000 mg | ORAL_TABLET | Freq: Once | ORAL | Status: DC | PRN

## 2012-11-22 MED ORDER — ONDANSETRON HCL 4 MG/2ML IJ SOLN
4.0000 mg | Freq: Once | INTRAMUSCULAR | Status: AC
  Administered 2012-11-22: 4 mg via INTRAVENOUS
  Filled 2012-11-22: qty 2

## 2012-11-22 SURGICAL SUPPLY — 47 items
APPLIER CLIP 5 13 M/L LIGAMAX5 (MISCELLANEOUS) ×2
BENZOIN TINCTURE PRP APPL 2/3 (GAUZE/BANDAGES/DRESSINGS) ×2 IMPLANT
CANISTER SUCTION 2500CC (MISCELLANEOUS) ×2 IMPLANT
CHLORAPREP W/TINT 26ML (MISCELLANEOUS) ×2 IMPLANT
CLIP APPLIE 5 13 M/L LIGAMAX5 (MISCELLANEOUS) ×1 IMPLANT
CLOTH BEACON ORANGE TIMEOUT ST (SAFETY) ×2 IMPLANT
CLSR STERI-STRIP ANTIMIC 1/2X4 (GAUZE/BANDAGES/DRESSINGS) ×2 IMPLANT
COVER MAYO STAND STRL (DRAPES) ×2 IMPLANT
COVER SURGICAL LIGHT HANDLE (MISCELLANEOUS) ×2 IMPLANT
DECANTER SPIKE VIAL GLASS SM (MISCELLANEOUS) ×4 IMPLANT
DRAPE C-ARM 42X72 X-RAY (DRAPES) ×2 IMPLANT
DRAPE UTILITY 15X26 W/TAPE STR (DRAPE) ×4 IMPLANT
DRSG TEGADERM 2-3/8X2-3/4 SM (GAUZE/BANDAGES/DRESSINGS) ×10 IMPLANT
ELECT REM PT RETURN 9FT ADLT (ELECTROSURGICAL) ×2
ELECTRODE REM PT RTRN 9FT ADLT (ELECTROSURGICAL) ×1 IMPLANT
GAUZE SPONGE 2X2 8PLY STRL LF (GAUZE/BANDAGES/DRESSINGS) ×1 IMPLANT
GLOVE BIO SURGEON STRL SZ7.5 (GLOVE) ×2 IMPLANT
GLOVE BIOGEL M 6.5 STRL (GLOVE) ×2 IMPLANT
GLOVE BIOGEL PI IND STRL 6.5 (GLOVE) ×1 IMPLANT
GLOVE BIOGEL PI IND STRL 7.0 (GLOVE) ×3 IMPLANT
GLOVE BIOGEL PI IND STRL 7.5 (GLOVE) ×1 IMPLANT
GLOVE BIOGEL PI IND STRL 8 (GLOVE) ×1 IMPLANT
GLOVE BIOGEL PI INDICATOR 6.5 (GLOVE) ×1
GLOVE BIOGEL PI INDICATOR 7.0 (GLOVE) ×3
GLOVE BIOGEL PI INDICATOR 7.5 (GLOVE) ×1
GLOVE BIOGEL PI INDICATOR 8 (GLOVE) ×1
GLOVE ECLIPSE 8.0 STRL XLNG CF (GLOVE) ×2 IMPLANT
GLOVE SURG SS PI 7.0 STRL IVOR (GLOVE) ×4 IMPLANT
GOWN STRL NON-REIN LRG LVL3 (GOWN DISPOSABLE) ×8 IMPLANT
KIT BASIN OR (CUSTOM PROCEDURE TRAY) ×2 IMPLANT
KIT ROOM TURNOVER OR (KITS) ×2 IMPLANT
NS IRRIG 1000ML POUR BTL (IV SOLUTION) ×2 IMPLANT
PAD ARMBOARD 7.5X6 YLW CONV (MISCELLANEOUS) ×2 IMPLANT
POUCH SPECIMEN RETRIEVAL 10MM (ENDOMECHANICALS) ×2 IMPLANT
SCISSORS LAP 5X35 DISP (ENDOMECHANICALS) ×2 IMPLANT
SET CHOLANGIOGRAPH 5 50 .035 (SET/KITS/TRAYS/PACK) ×2 IMPLANT
SET IRRIG TUBING LAPAROSCOPIC (IRRIGATION / IRRIGATOR) ×2 IMPLANT
SLEEVE ENDOPATH XCEL 5M (ENDOMECHANICALS) ×4 IMPLANT
SPECIMEN JAR SMALL (MISCELLANEOUS) ×2 IMPLANT
SPONGE GAUZE 2X2 STER 10/PKG (GAUZE/BANDAGES/DRESSINGS) ×1
SUT MON AB 4-0 PC3 18 (SUTURE) ×2 IMPLANT
TOWEL OR 17X24 6PK STRL BLUE (TOWEL DISPOSABLE) ×2 IMPLANT
TOWEL OR 17X26 10 PK STRL BLUE (TOWEL DISPOSABLE) ×2 IMPLANT
TRAY LAPAROSCOPIC (CUSTOM PROCEDURE TRAY) ×2 IMPLANT
TROCAR XCEL BLUNT TIP 100MML (ENDOMECHANICALS) ×2 IMPLANT
TROCAR XCEL NON-BLD 11X100MML (ENDOMECHANICALS) IMPLANT
TROCAR XCEL NON-BLD 5MMX100MML (ENDOMECHANICALS) ×2 IMPLANT

## 2012-11-22 NOTE — ED Provider Notes (Signed)
CSN: [id number]     Arrival date & time 11/22/12  0940 History   First MD Initiated Contact with Patient 11/22/12 1016     Chief Complaint  Patient presents with  . Abdominal Pain   (Consider location/radiation/quality/duration/timing/severity/associated sxs/prior Treatment) HPI Comments: Patient with no past surgical history presents with complaint of epigastric and right upper quadrant abdominal pain that is been intermittent for years but more persistent over the past several days. Symptoms are made much worse with food, especially greasy foods. She's been avoiding food so she doesn't experience pain. Pain radiates to between her shoulder blades. She denies chest pain or shortness of breath. She has had nausea but no vomiting. She's been taking Aleve for pain without any relief. She has had some soft stools but no watery diarrhea. No urinary symptoms. She had abdominal ultrasound performed several years ago which did not show any gallstones. She denies heavy alcohol use. This morning the pain was so bad she was having difficulty lifting up her young child. She's been avoiding food so she doesn't experience pain. The onset of this condition was acute. The course is constant.   Patient is a 27 y.o. female presenting with abdominal pain. The history is provided by the patient.  Abdominal Pain Associated symptoms: diarrhea and nausea   Associated symptoms: no chest pain, no cough, no dysuria, no fever, no sore throat and no vomiting     Past Medical History  Diagnosis Date  . Hypertension     gestational hypertension  . Molar pregnancy    Past Surgical History  Procedure Laterality Date  . Cleft lip repair    . Wisdom tooth extraction    . Dilation and curettage of uterus  2011   Family History  Problem Relation Age of Onset  . Scoliosis Paternal Grandfather   . Hypertension Maternal Grandmother   . Hypertension Maternal Grandfather   . Other Mother     varicose veins  .  Hypothyroidism Mother   . Irritable bowel syndrome Mother   . Hypertension Mother   . Arthritis Mother   . Bursitis Mother   . Seizures Other     nephew   History  Substance Use Topics  . Smoking status: Never Smoker   . Smokeless tobacco: Never Used  . Alcohol Use: Yes     Comment: occasionally    OB History   Grav Para Term Preterm Abortions TAB SAB Ect Mult Living   2 1 1  0 1 0 0 0 0 1     Review of Systems  Constitutional: Negative for fever.  HENT: Negative for sore throat and rhinorrhea.   Eyes: Negative for redness.  Respiratory: Negative for cough.   Cardiovascular: Negative for chest pain.  Gastrointestinal: Positive for nausea, abdominal pain and diarrhea. Negative for vomiting and blood in stool.  Genitourinary: Negative for dysuria.  Musculoskeletal: Positive for back pain. Negative for myalgias.  Skin: Negative for rash.  Neurological: Negative for headaches.    Allergies  Clarithromycin  Home Medications   Current Outpatient Rx  Name  Route  Sig  Dispense  Refill  . loratadine (CLARITIN) 10 MG tablet   Oral   Take 10 mg by mouth daily as needed for allergies.          . naproxen sodium (ANAPROX) 220 MG tablet   Oral   Take 220 mg by mouth daily as needed (for pain).         . Norgestimate-Ethinyl Estradiol Triphasic (TRINESSA,  28,) 0.18/0.215/0.25 MG-35 MCG tablet   Oral   Take 1 tablet by mouth at bedtime.         . ranitidine (ZANTAC) 150 MG tablet   Oral   Take 150 mg by mouth 2 (two) times daily.         . simethicone (GAS-X EXTRA STRENGTH) 125 MG chewable tablet   Oral   Chew 250 mg by mouth every 6 (six) hours as needed (stomach pain).          BP 134/94  Pulse 137  Temp(Src) 98.2 F (36.8 C) (Oral)  Resp 18  Ht 5\' 5"  (1.651 m)  Wt 183 lb 13.8 oz (83.399 kg)  BMI 30.6 kg/m2  SpO2 99%  LMP 11/07/2012 Physical Exam  Nursing note and vitals reviewed. Constitutional: She appears well-developed and well-nourished.   HENT:  Head: Normocephalic and atraumatic.  Eyes: Conjunctivae are normal. Right eye exhibits no discharge. Left eye exhibits no discharge.  Neck: Normal range of motion. Neck supple.  Cardiovascular: Normal rate, regular rhythm and normal heart sounds.   Pulmonary/Chest: Effort normal and breath sounds normal.  Abdominal: Soft. Bowel sounds are normal. She exhibits no distension. There is tenderness. There is no rebound and no guarding.  Mild tenderness in epigastrium and right upper quadrant. Negative Murphy's sign.  Musculoskeletal: She exhibits no edema and no tenderness.  No tenderness to palpation of thoracic and lumbar back.  Neurological: She is alert.  Skin: Skin is warm and dry.  Psychiatric: She has a normal mood and affect.    ED Course  Procedures (including critical care time) Labs Review Labs Reviewed  CBC WITH DIFFERENTIAL - Abnormal; Notable for the following:    WBC 12.4 (*)    Neutro Abs 9.3 (*)    All other components within normal limits  COMPREHENSIVE METABOLIC PANEL - Abnormal; Notable for the following:    Total Bilirubin 0.2 (*)    All other components within normal limits  URINALYSIS, ROUTINE W REFLEX MICROSCOPIC - Abnormal; Notable for the following:    Hgb urine dipstick TRACE (*)    Ketones, ur 15 (*)    Leukocytes, UA TRACE (*)    All other components within normal limits  LIPASE, BLOOD  URINE MICROSCOPIC-ADD ON  POCT PREGNANCY, URINE   Imaging Review No results found.  10:49 AM Patient seen and examined. Work-up initiated. Medications ordered.   Vital signs reviewed and are as follows: Filed Vitals:   11/22/12 0944  BP: 134/94  Pulse: 137  Temp: 98.2 F (36.8 C)  Resp: 18   12:29 PM Pt informed of results. Pain controlled after IV pain meds. Spoke with surgery who would like to see patient. Consult pending.   1:17 PM Surgery admitting.    MDM   1. Biliary colic    Admit for symptomatic biliary colic.    Renne Crigler,  PA-C 11/22/12 1317

## 2012-11-22 NOTE — Preoperative (Signed)
Beta Blockers   Reason not to administer Beta Blockers:Not Applicable 

## 2012-11-22 NOTE — Transfer of Care (Signed)
Immediate Anesthesia Transfer of Care Note  Patient: Destiny Pena  Procedure(s) Performed: Procedure(s): LAPAROSCOPIC CHOLECYSTECTOMY WITH INTRAOPERATIVE CHOLANGIOGRAM (N/A)  Patient Location: PACU  Anesthesia Type:General  Level of Consciousness: awake, alert , oriented and patient cooperative  Airway & Oxygen Therapy: Patient Spontanous Breathing  Post-op Assessment: Report given to PACU RN, Post -op Vital signs reviewed and stable and Patient moving all extremities X 4  Post vital signs: Reviewed and stable  Complications: No apparent anesthesia complications

## 2012-11-22 NOTE — Anesthesia Postprocedure Evaluation (Signed)
Anesthesia Post Note  Patient: Destiny Pena  Procedure(s) Performed: Procedure(s) (LRB): LAPAROSCOPIC CHOLECYSTECTOMY WITH INTRAOPERATIVE CHOLANGIOGRAM (N/A)  Anesthesia type: General  Patient location: PACU  Post pain: Pain level controlled and Adequate analgesia  Post assessment: Post-op Vital signs reviewed, Patient's Cardiovascular Status Stable, Respiratory Function Stable, Patent Airway and Pain level controlled  Last Vitals:  Filed Vitals:   11/22/12 1715  BP:   Pulse: 66  Temp:   Resp: 10    Post vital signs: Reviewed and stable  Level of consciousness: awake, alert  and oriented  Complications: No apparent anesthesia complications

## 2012-11-22 NOTE — ED Notes (Signed)
Pt c/o upper abd pain that radiates in between shoulder blades.  Pain increases after she heats.  States same pain several years ago, and MD stated that there were no gall stones, but her "enzymes were elevated. C/o nausea, and sob when pain is severe.  Also c/o loose stools.

## 2012-11-22 NOTE — ED Notes (Signed)
MD at bedside. 

## 2012-11-22 NOTE — H&P (Signed)
Destiny Pena 09/13/1985  [id number].    PCP:  Willadean Carol Requesting MD: Dr. Zadie Rhine  Chief Complaint/Reason for Consult: Cholelithiasis HPI:  27 y/o G2P1A1 who presented to [facility] with about a 1-1.5 years of intermittent abdominal pain since she delivered her son 19 months ago. Over the last 2 weeks she notes 4 episodes of severe RUQ and epigastric abdominal pain and nausea.  The pain would wake her up from sleep.  She tried to take antacids and watching what she eats.  If she eats something greasy it brings on the pain, but currently any food she eats bothers her.  The pain worsened at 1:30am the day prior to admission which started very severely.  The pain has persisted and so she made an appointment with her PCP, but she couldn't wait until her appointment time which is why she came to the ER.  C/o nausea without vomiting, +beldching, no diarrhea, +chills but no fevers.  No chest pain, SOB, changes in bowel/bladder habits.  PMH HTN during pregnancy.  PSH cleft lip repair, WTE and D&C uterus.    ROS: All systems reviewed and otherwise negative except for as above  Family History  Problem Relation Age of Onset  . Scoliosis Paternal Grandfather   . Hypertension Maternal Grandmother   . Hypertension Maternal Grandfather   . Other Mother     varicose veins  . Hypothyroidism Mother   . Irritable bowel syndrome Mother   . Hypertension Mother   . Arthritis Mother   . Bursitis Mother   . Seizures Other     nephew    Past Medical History  Diagnosis Date  . Hypertension     gestational hypertension  . Molar pregnancy     Past Surgical History  Procedure Laterality Date  . Cleft lip repair    . Wisdom tooth extraction    . Dilation and curettage of uterus  2011    Social History:  reports that she has never smoked. She has never used smokeless tobacco. She reports that  drinks alcohol. She reports that she does not use illicit drugs.  Allergies:  Allergies   Allergen Reactions  . Clarithromycin Hives and Itching    Biaxin     (Not in a hospital admission)  Blood pressure 131/89, pulse 103, temperature 98.2 F (36.8 C), temperature source Oral, resp. rate 16, height 5\' 5"  (1.651 m), weight 183 lb 13.8 oz (83.399 kg), last menstrual period 11/07/2012, SpO2 100.00%. Physical Exam: General: pleasant, WD/WN white female who is laying in bed in NAD HEENT: head is normocephalic, atraumatic.  Sclera are noninjected.  PERRL.  Ears and nose without any masses or lesions.  Mouth is pink and moist Heart: regular, rate, and rhythm.  No obvious murmurs, gallops, or rubs noted.  Palpable pedal pulses bilaterally Lungs: CTAB, no wheezes, rhonchi, or rales noted.  Respiratory effort nonlabored Abd: Soft, ND, tender in RUQ and epigastrium, +BS, no masses, hernias, or organomegaly MS: all 4 extremities are symmetrical with no cyanosis, clubbing, or edema. Skin: warm and dry with no masses, lesions, or rashes Psych: A&Ox3 with an appropriate affect.   Results for orders placed during the hospital encounter of 11/22/12 (from the past 48 hour(s))  CBC WITH DIFFERENTIAL     Status: Abnormal   Collection Time    11/22/12  9:49 AM      Result Value Range   WBC 12.4 (*) 4.0 - 10.5 K/uL   RBC 4.81  3.87 - 5.11  MIL/uL   Hemoglobin 13.3  12.0 - 15.0 g/dL   HCT 16.1  09.6 - 04.5 %   MCV 81.1  78.0 - 100.0 fL   MCH 27.7  26.0 - 34.0 pg   MCHC 34.1  30.0 - 36.0 g/dL   RDW 40.9  81.1 - 91.4 %   Platelets 291  150 - 400 K/uL   Neutrophils Relative % 75  43 - 77 %   Neutro Abs 9.3 (*) 1.7 - 7.7 K/uL   Lymphocytes Relative 20  12 - 46 %   Lymphs Abs 2.5  0.7 - 4.0 K/uL   Monocytes Relative 5  3 - 12 %   Monocytes Absolute 0.6  0.1 - 1.0 K/uL   Eosinophils Relative 0  0 - 5 %   Eosinophils Absolute 0.0  0.0 - 0.7 K/uL   Basophils Relative 0  0 - 1 %   Basophils Absolute 0.0  0.0 - 0.1 K/uL  LIPASE, BLOOD     Status: None   Collection Time    11/22/12  9:49  AM      Result Value Range   Lipase 23  11 - 59 U/L  COMPREHENSIVE METABOLIC PANEL     Status: Abnormal   Collection Time    11/22/12  9:49 AM      Result Value Range   Sodium 135  135 - 145 mEq/L   Potassium 3.6  3.5 - 5.1 mEq/L   Chloride 100  96 - 112 mEq/L   CO2 22  19 - 32 mEq/L   Glucose, Bld 84  70 - 99 mg/dL   BUN 8  6 - 23 mg/dL   Creatinine, Ser 7.82  0.50 - 1.10 mg/dL   Calcium 9.1  8.4 - 95.6 mg/dL   Total Protein 7.8  6.0 - 8.3 g/dL   Albumin 3.8  3.5 - 5.2 g/dL   AST 15  0 - 37 U/L   ALT 14  0 - 35 U/L   Alkaline Phosphatase 53  39 - 117 U/L   Total Bilirubin 0.2 (*) 0.3 - 1.2 mg/dL   GFR calc non Af Amer >90  >90 mL/min   GFR calc Af Amer >90  >90 mL/min   Comment: (NOTE)     The eGFR has been calculated using the CKD EPI equation.     This calculation has not been validated in all clinical situations.     eGFR's persistently <90 mL/min signify possible Chronic Kidney     Disease.  URINALYSIS, ROUTINE W REFLEX MICROSCOPIC     Status: Abnormal   Collection Time    11/22/12 10:42 AM      Result Value Range   Color, Urine YELLOW  YELLOW   APPearance CLEAR  CLEAR   Specific Gravity, Urine 1.009  1.005 - 1.030   pH 7.0  5.0 - 8.0   Glucose, UA NEGATIVE  NEGATIVE mg/dL   Hgb urine dipstick TRACE (*) NEGATIVE   Bilirubin Urine NEGATIVE  NEGATIVE   Ketones, ur 15 (*) NEGATIVE mg/dL   Protein, ur NEGATIVE  NEGATIVE mg/dL   Urobilinogen, UA 0.2  0.0 - 1.0 mg/dL   Nitrite NEGATIVE  NEGATIVE   Leukocytes, UA TRACE (*) NEGATIVE  URINE MICROSCOPIC-ADD ON     Status: None   Collection Time    11/22/12 10:42 AM      Result Value Range   Squamous Epithelial / LPF RARE  RARE   WBC, UA 0-2  <3  WBC/hpf   RBC / HPF 0-2  <3 RBC/hpf   Bacteria, UA RARE  RARE  POCT PREGNANCY, URINE     Status: None   Collection Time    11/22/12 10:49 AM      Result Value Range   Preg Test, Ur NEGATIVE  NEGATIVE   Comment:            THE SENSITIVITY OF THIS     METHODOLOGY IS >24  mIU/mL   US Abdomen Complete  11/22/2012   CLINICAL DATA:  History of abdominal pain.  EXAM: ULTRASOUND ABDOMEN COMPLETE  COMPARISON:  03/27/2009 study.  FINDINGS: Gallbladder  Multiple gallstones are seen within the gallbladder representing cholelithiasis. The largest calculus had the greatest diameter of 13 mm. Gallbladder wall thickness is normal measuring 2.2 mm. No pericholecystic fluid. No positive sonographic Murphy's sign is present according to the technologist.  Common bile duct  Diameter: Common bile duct measured 3.4 mm. No choledocholithiasis was evident.  Liver  No focal lesion identified. Within normal limits in parenchymal echogenicity. Portal vein is patent with hepatopetal flow.  IVC  No abnormality visualized.  Pancreas  Visualized portion unremarkable.  Spleen  Size and appearance within normal limits.  Splenic length is 8 cm.  Right Kidney  Length: Right renal length is 12.0 cm. Echogenicity within normal limits. No mass or hydronephrosis visualized.  Left Kidney  Length: Left renal length is 12.0 cm. Echogenicity within normal limits. No mass or hydronephrosis visualized.  Abdominal aorta  No aneurysm visualized.  Maximum diameter is 1.8 cm.  IMPRESSION: Multiple gallstones are seen within the gallbladder representing cholelithiasis. No ultrasonic evidence of acute cholecystitis is seen. Bile ducts appear normal.   Electronically Signed   By: Onalee Hua  Call M.D.   On: 11/22/2012 11:56    Assessment/Plan Biliary colic Cholelithiasis Leukocytosis  Plan: 1.  Admit to [facility], plan for lap chole with IOC 2.  IVF, pain control, antiemetics, antibiotics 3.  Will discuss with Dr. Abbey Chatters whether we can get her scheduled today or tomorrow.  Discussed risks of surgery including bleeding, infection, conversion to open, bile leak, or injury to other abdominal structures 4.  Hopefully can be discharged tomorrow if surgery today   Aris Georgia 11/22/2012, 12:57 PM Pager: [phone]

## 2012-11-22 NOTE — Anesthesia Preprocedure Evaluation (Signed)
Anesthesia Evaluation  Patient identified by MRN, date of birth, ID band Patient awake    Reviewed: Allergy & Precautions, H&P , NPO status   History of Anesthesia Complications (+) PONV  Airway Mallampati: I      Dental  (+) Teeth Intact   Pulmonary neg pulmonary ROS,  breath sounds clear to auscultation        Cardiovascular Rhythm:Regular Rate:Tachycardia  tachyacardic today    Neuro/Psych    GI/Hepatic negative GI ROS, Neg liver ROS,   Endo/Other    Renal/GU negative Renal ROS     Musculoskeletal   Abdominal   Peds  Hematology negative hematology ROS (+)   Anesthesia Other Findings   Reproductive/Obstetrics                           Anesthesia Physical Anesthesia Plan  ASA: II  Anesthesia Plan: General   Post-op Pain Management:    Induction: Intravenous  Airway Management Planned: Oral ETT  Additional Equipment:   Intra-op Plan:   Post-operative Plan: Extubation in OR  Informed Consent: I have reviewed the patients History and Physical, chart, labs and discussed the procedure including the risks, benefits and alternatives for the proposed anesthesia with the patient or authorized representative who has indicated his/her understanding and acceptance.   Dental advisory given  Plan Discussed with: CRNA and Surgeon  Anesthesia Plan Comments:         Anesthesia Quick Evaluation

## 2012-11-22 NOTE — H&P (Signed)
Patient seen and examined in ED.  Plan laparoscopic, possible open cholecystectomy today.  I have explained the procedure, risks, and aftercare of cholecystectomy.  Risks include but are not limited to bleeding, infection, wound problems, anesthesia, diarrhea, bile leak, injury to common bile duct/liver/intestine.  She seems to understand and agrees to proceed.

## 2012-11-22 NOTE — ED Provider Notes (Signed)
Medical screening examination/treatment/procedure(s) were conducted as a shared visit with non-physician practitioner(s) and myself.  I personally evaluated the patient during the encounter  Pt stable in the ED but symptomatic biliary colic, advised surgery consult   Joya Gaskins, MD 11/22/12 1547

## 2012-11-22 NOTE — Op Note (Signed)
Operative Note  Destiny Pena female 27 y.o. 11/22/2012  PREOPERATIVE DX:  Acute cholecystitis with cholelithiasis  POSTOPERATIVE DX:  Same  PROCEDURE:  Laparoscopic cholecystectomy with cholangiogram         Surgeon: Adolph Pollack   Assistants: Aris Georgia, P.A.  Anesthesia: General endotracheal anesthesia  Indications: This is a 27 year old female whose been having biliary colic type symptoms for 19 months. She woke early this morning with the same type of pain that has persisted. She presented to the emergency department. Ultrasound demonstrated multiple gallstones. White blood cell count was elevated. Liver function tests are normal. She has right upper quadrant pain and guarding with exam. She is brought to the operating room for cholecystectomy.    Procedure Detail:  She was brought to the operating room placed supine on the operating table and general anesthetic was administered. The abdominal wall was widely sterilely prepped and draped. Marcaine was infiltrated in the subumbilical area.  A small subumbilical incision was made through the skin and subcutaneous tissue. The fascia was identified and small incision made in the anterior and posterior fascia. The peritoneal cavity was entered under direct vision. A pursestring suture of 0 Vicryl was placed around the edges of the fascia. A Hassan trocar was introduced into the peritoneal cavity and pneumoperitoneum was created.  A 5 mm trocar was placed in the epigastric region. Two 5 mm trocars were placed in the right upper quadrant area.  The gallbladder was noted to be inflamed and edematous. The fundus of the gallbladder was grasped and retracted toward the right shoulder. Using some blunt dissection and careful electrocautery the infundibulum was mobilized and retracted laterally. The cystic duct was identified and a window was created around it. The critical view was achieved. A clip was placed at the cystic duct  gallbladder junction. A small incision was made in the cystic duct. A cholangiocatheter was passed through the anterior abdominal wall and placed into the cystic duct and a cholangiogram was performed.  Under real-time fluoroscopy dilute contrast was injected into the cystic duct. The common hepatic duct, right and left hepatic ducts, and common bile duct all filled promptly. Contrast drained promptly into the duodenum without obvious evidence of obstruction. Final report is pending the interpretation of the radiologist.  The cholangiocatheter was removed, the cystic duct was clipped 3 times and the biliary side and divided. A posterior branch of the cystic artery was identified. A window was created around it. It was clipped and divided. Subsequently, an anterior branch of the cystic artery was identified. A window was created around it. It was clipped and divided.  The gallbladder was then dissected free from the liver using electrocautery. The gallbladder was placed in a retrieval bag and removed through the subumbilical port. The gallbladder fossa was irrigated inspected and hemostasis was adequate. There was no evidence of bile leak.   The gallbladder was then removed through the subumbilical incision. A subumbilical fascia was then closed by tightening up and tied down the pursestring suture.  The remaining trocars were removed and the pneumoperitoneum was released.  The skin incisions were closed with 4-0 Monocryl subcuticular stitches. Steri-Strips and sterile dressings were applied. She tolerated the procedure well without any apparent complications and was taken to the recovery room in satisfactory condition.   Estimated Blood Loss:  150 ml         Drains: none           Blood Given: none  Specimens: Gallbladder        Complications:  * No complications entered in OR log *         Disposition: PACU - hemodynamically stable.         Condition: stable

## 2012-11-22 NOTE — ED Notes (Signed)
Called report to Denyse Dago.

## 2012-11-23 ENCOUNTER — Encounter (HOSPITAL_COMMUNITY): Payer: Self-pay | Admitting: General Surgery

## 2012-11-23 MED ORDER — INFLUENZA VAC SPLIT QUAD 0.5 ML IM SUSP: 0.5000 mL | INTRAMUSCULAR | Status: DC

## 2012-11-23 MED ORDER — ACETAMINOPHEN 325 MG PO TABS: 650.0000 mg | ORAL_TABLET | Freq: Four times a day (QID) | ORAL | Status: DC | PRN

## 2012-11-23 MED ORDER — HYDROCODONE-ACETAMINOPHEN 5-325 MG PO TABS: 1.0000 | ORAL_TABLET | ORAL | Status: DC | PRN

## 2012-11-23 NOTE — Discharge Summary (Signed)
Physician Discharge Summary  Patient ID: Destiny Pena MRN: [id number] DOB/AGE: [birth year] 27 y.o.  Admit date: 11/22/2012 Discharge date: 11/23/2012  Admitting Diagnosis: Biliary colic Cholelithiasis Leukocytosis   Discharge Diagnosis Patient Active Problem List   Diagnosis Date Noted  . Hx of dysmenorrhea 04/09/2012  . Hx of menorrhagia 04/09/2012  . History of kidney stones 04/09/2012  acute cholecystitis  Cholelithiasis   Consultants none  Imaging: Dg Cholangiogram Operative  11/22/2012   CLINICAL DATA:  Cholelithiasis.  EXAM: INTRAOPERATIVE CHOLANGIOGRAM  TECHNIQUE: Cholangiographic images from the C-arm fluoroscopic device were submitted for interpretation post-operatively. Please see the procedural report for the amount of contrast and the fluoroscopy time utilized.  COMPARISON:  Ultrasound 11/22/2012.  FINDINGS: The cystic duct is cannulated. Injection of contrast material demonstrates at normal caliber common bile duct and normal intrahepatic ducts. No filling defects to suggest common bile duct stones. Slight reflux of contrast into the pancreatic duct is noted. There is drainage of contrast into the duodenum.  IMPRESSION: Normal intraoperative cholangiogram.   Electronically Signed   By: Loralie Champagne M.D.   On: 11/22/2012 21:45   US Abdomen Complete  11/22/2012   CLINICAL DATA:  History of abdominal pain.  EXAM: ULTRASOUND ABDOMEN COMPLETE  COMPARISON:  03/27/2009 study.  FINDINGS: Gallbladder  Multiple gallstones are seen within the gallbladder representing cholelithiasis. The largest calculus had the greatest diameter of 13 mm. Gallbladder wall thickness is normal measuring 2.2 mm. No pericholecystic fluid. No positive sonographic Murphy's sign is present according to the technologist.  Common bile duct  Diameter: Common bile duct measured 3.4 mm. No choledocholithiasis was evident.  Liver  No focal lesion identified. Within normal limits in parenchymal echogenicity.  Portal vein is patent with hepatopetal flow.  IVC  No abnormality visualized.  Pancreas  Visualized portion unremarkable.  Spleen  Size and appearance within normal limits.  Splenic length is 8 cm.  Right Kidney  Length: Right renal length is 12.0 cm. Echogenicity within normal limits. No mass or hydronephrosis visualized.  Left Kidney  Length: Left renal length is 12.0 cm. Echogenicity within normal limits. No mass or hydronephrosis visualized.  Abdominal aorta  No aneurysm visualized.  Maximum diameter is 1.8 cm.  IMPRESSION: Multiple gallstones are seen within the gallbladder representing cholelithiasis. No ultrasonic evidence of acute cholecystitis is seen. Bile ducts appear normal.   Electronically Signed   By: Onalee Hua  Call M.D.   On: 11/22/2012 11:56    Procedures Laparoscopic cholecystectomy with IOC(Dr. Abbey Chatters 11/22/12)  Hospital Course:  Destiny Pena is a 27 year old female with a history of hypertension  who presented to [facility] with 1-1.5 years of intermittent abdominal pain.  Workup showed leukocytosis, biliary colic.  Patient was admitted and underwent procedure listed above.  Tolerated procedure well and was transferred to the floor.  Diet was advanced as tolerated.  On POD #1, the patient was voiding well, tolerating diet, ambulating well, pain well controlled, vital signs stable, incisions c/d/i and felt stable for discharge home.  Patient will follow up in our office in 3 weeks and knows to call with questions or concerns.  We discussed warning signs that warrant immediate ED attention.  We discussed medication risks, benefits and restrictions.  She may return to work in 1 week.  Physical Exam: General:  Alert, NAD, pleasant, comfortable Abd:  Soft, ND, mild tenderness, incisions C/D/I    Medication List         acetaminophen 325 MG tablet  Commonly known as:  TYLENOL  Take 2 tablets (650 mg total) by mouth every 6 (six) hours as needed.     GAS-X EXTRA STRENGTH 125 MG  chewable tablet  Generic drug:  simethicone  Chew 250 mg by mouth every 6 (six) hours as needed (stomach pain).     HYDROcodone-acetaminophen 5-325 MG per tablet  Commonly known as:  NORCO/VICODIN  Take 1 tablet by mouth every 4 (four) hours as needed.     loratadine 10 MG tablet  Commonly known as:  CLARITIN  Take 10 mg by mouth daily as needed for allergies.     naproxen sodium 220 MG tablet  Commonly known as:  ANAPROX  Take 220 mg by mouth daily as needed (for pain).     ranitidine 150 MG tablet  Commonly known as:  ZANTAC  Take 150 mg by mouth 2 (two) times daily.     TRINESSA (28) 0.18/0.215/0.25 MG-35 MCG tablet  Generic drug:  Norgestimate-Ethinyl Estradiol Triphasic  Take 1 tablet by mouth at bedtime.             Follow-up Information   Follow up with [facility] On 12/18/2012. (arrive by 3pm, for 3:30pm appt)    Contact information:   [address]   [place] [address] [phone]       Signed: Ashok Norris, [facility] Surgery [phone]  11/23/2012, 10:03 AM

## 2012-11-23 NOTE — Progress Notes (Signed)
Pt discharged to home

## 2012-12-18 ENCOUNTER — Ambulatory Visit (INDEPENDENT_AMBULATORY_CARE_PROVIDER_SITE_OTHER): Payer: 59 | Admitting: General Surgery

## 2012-12-18 ENCOUNTER — Encounter (INDEPENDENT_AMBULATORY_CARE_PROVIDER_SITE_OTHER): Payer: Self-pay | Admitting: General Surgery

## 2012-12-18 VITALS — BP 146/84 | HR 88 | Resp 16 | Ht 64.5 in | Wt 179.0 lb

## 2012-12-18 DIAGNOSIS — K8000 Calculus of gallbladder with acute cholecystitis without obstruction: Secondary | ICD-10-CM

## 2012-12-18 NOTE — Patient Instructions (Signed)
Call if you have a problem. 

## 2012-12-18 NOTE — Progress Notes (Signed)
Destiny Pena 09/06/1985 [id number] 12/18/2012   Destiny Pena is a 27 y.o. female who had a laparoscopic cholecystectomy with intraoperative cholangiogram by Dr. Adolph Pollack, MD  The pathology report confirmed Gallbladder - CHRONIC CHOLECYSTITIS AND MILD ACUTE CHOLECYSTITIS. - CHOLELITHIASIS.Marland Kitchen   The patient reports that they are feeling well with normal bowel movements and good appetite.  The pre-operative symptoms of abdominal pain, nausea, and vomiting have resolved.    Physical examination - BP 146/84  Pulse 88  Resp 16  Ht 5' 4.5" (1.638 m)  Wt 81.194 kg (179 lb)  BMI 30.26 kg/m2  LMP 11/07/2012  Incisions appear well-healed with no sign of infection or bleeding.   Abdomen - soft, non-tender  Impression:  s/p laparoscopic cholecystectomy  Plan:  She may resume a regular diet and full activity.  She may follow-up on a PRN basis.

## 2012-12-27 ENCOUNTER — Other Ambulatory Visit: Payer: Self-pay

## 2013-12-23 ENCOUNTER — Encounter (INDEPENDENT_AMBULATORY_CARE_PROVIDER_SITE_OTHER): Payer: Self-pay | Admitting: General Surgery

## 2015-12-07 LAB — OB RESULTS CONSOLE RUBELLA ANTIBODY, IGM: RUBELLA: IMMUNE

## 2015-12-07 LAB — OB RESULTS CONSOLE GC/CHLAMYDIA
Chlamydia: NEGATIVE
Gonorrhea: NEGATIVE

## 2015-12-07 LAB — OB RESULTS CONSOLE HEPATITIS B SURFACE ANTIGEN: Hepatitis B Surface Ag: NEGATIVE

## 2015-12-07 LAB — OB RESULTS CONSOLE RPR: RPR: NONREACTIVE

## 2015-12-07 LAB — OB RESULTS CONSOLE HIV ANTIBODY (ROUTINE TESTING): HIV: NONREACTIVE

## 2016-02-17 ENCOUNTER — Telehealth: Payer: Self-pay | Admitting: Internal Medicine

## 2016-02-17 NOTE — Telephone Encounter (Signed)
Received records from [facility] for appointment with Dr Rennis GoldenHilty on 03/03/16.  [facility] Dr Blanchie DessertHilty's schedule on 03/03/16. lp

## 2016-03-03 ENCOUNTER — Ambulatory Visit (INDEPENDENT_AMBULATORY_CARE_PROVIDER_SITE_OTHER): Payer: 59 | Admitting: Internal Medicine

## 2016-03-03 ENCOUNTER — Encounter: Payer: Self-pay | Admitting: Internal Medicine

## 2016-03-03 VITALS — BP 138/82 | HR 103 | Ht 64.0 in | Wt 182.4 lb

## 2016-03-03 DIAGNOSIS — R000 Tachycardia, unspecified: Secondary | ICD-10-CM

## 2016-03-03 DIAGNOSIS — I10 Essential (primary) hypertension: Secondary | ICD-10-CM | POA: Diagnosis not present

## 2016-03-03 DIAGNOSIS — Z3A15 15 weeks gestation of pregnancy: Secondary | ICD-10-CM | POA: Insufficient documentation

## 2016-03-03 NOTE — Progress Notes (Signed)
OFFICE NOTE  Chief Complaint:  [redacted] weeks pregnant, tachycardia/hypertension  Primary Care Physician: Cassell SmilesFUSCO,LAWRENCE J., MD  HPI:  Destiny Pena is a 31 y.o. female who presents for evaluation of tachycardia and hypertension at the request of her OB/GYN Dr. Candice Campavid Lowe. This is her second pregnancy and she has a 29-year-old boy. Her first pregnancy was complicated by progressive hypertension however she did not have definite preeclampsia and was not treated with antihypertensives but did develop significant lower extremity swelling and had emergent delivery based on this. Subsequently she's had some degree of essential hypertension and is on hydrochlorothiazide which she is taken for the past 2 years. In general this is provided good control of her blood pressure. I reviewed home blood pressure readings which indicate systolics between 105 and 135. Blood pressure today was 138/82. She denies any chest pain or significant worsening shortness of breath. She does report some fatigue. She also notes that she is tachycardic. She says she's always had somewhat of a high heart rate from what she knows however there was some concern about this being inappropriately elevated. In addition there was a question about when she would require therapy for worsening hypertension.  PMHx:  Past Medical History:  Diagnosis Date  . Hypertension    gestational hypertension  . Molar pregnancy     Past Surgical History:  Procedure Laterality Date  . CHOLECYSTECTOMY N/A 11/22/2012   Procedure: LAPAROSCOPIC CHOLECYSTECTOMY WITH INTRAOPERATIVE CHOLANGIOGRAM;  Surgeon: Adolph Pollackodd J Rosenbower, MD;  Location: [facility] OR;  Service: General;  Laterality: N/A;  . CLEFT LIP REPAIR    . DILATION AND CURETTAGE OF UTERUS  2011  . WISDOM TOOTH EXTRACTION      FAMHx:  Family History  Problem Relation Age of Onset  . Other Mother     varicose veins  . Hypothyroidism Mother   . Irritable bowel syndrome Mother   . Hypertension  Mother   . Arthritis Mother   . Bursitis Mother   . Scoliosis Paternal Grandfather   . Cancer Paternal Grandfather     melanoma  . Hypertension Maternal Grandmother   . Hypertension Maternal Grandfather   . Seizures Other     nephew    SOCHx:   reports that she has never smoked. She has never used smokeless tobacco. She reports that she drinks alcohol. She reports that she does not use drugs.  ALLERGIES:  Allergies  Allergen Reactions  . Clarithromycin Hives and Itching    Biaxin  . Sulfur Rash    High fever     ROS: Pertinent items noted in HPI and remainder of comprehensive ROS otherwise negative.  HOME MEDS: Current Outpatient Prescriptions on File Prior to Visit  Medication Sig Dispense Refill  . acetaminophen (TYLENOL) 325 MG tablet Take 2 tablets (650 mg total) by mouth every 6 (six) hours as needed.    . hydrochlorothiazide (HYDRODIURIL) 25 MG tablet Take 1 tablet by mouth daily.  12  . loratadine (CLARITIN) 10 MG tablet Take 10 mg by mouth daily as needed for allergies.      No current facility-administered medications on file prior to visit.     LABS/IMAGING: No results found for this or any previous visit (from the past 48 hour(s)). No results found.  WEIGHTS: Wt Readings from Last 3 Encounters:  03/03/16 182 lb 6.4 oz (82.7 kg)  12/18/12 179 lb (81.2 kg)  11/22/12 183 lb (83 kg)    VITALS: BP 138/82   Pulse (!) 103  Ht 5\' 4"  (1.626 m)   Wt 182 lb 6.4 oz (82.7 kg)   BMI 31.31 kg/m   EXAM: General appearance: alert and no distress Neck: no carotid bruit and no JVD Lungs: clear to auscultation bilaterally Heart: regular rate and rhythm Abdomen: soft, non-tender; bowel sounds normal; no masses,  no organomegaly Extremities: extremities normal, atraumatic, no cyanosis or edema Pulses: 2+ and symmetric Skin: Skin color, texture, turgor normal. No rashes or lesions Neurologic: Grossly normal Psych: Pleasant  EKG: Sinus tachycardia  103  ASSESSMENT: 1. Sinus tachycardia 2. [redacted] weeks pregnant 3. Essential hypertension - possible history of pre-eclampsia  PLAN: 1.   Mrs. Pena has essential hypertension which is well-controlled on HCTZ. This should not present significant risk to the fetus as she is established on the medication and she could continue it. Blood pressure is now well controlled however I would be aggressive about treating blood pressures that are greater than 150/90. That being said, I would not add additional agents for as long as possible during the pregnancy. If she does develop preeclamptic symptoms during her third trimester, then I would recommend using labetalol for both heart rate and blood pressure benefit. Will obtain an echocardiogram to rule out cardiopathy although physical exam does not suggest that, although she is tachycardic, but this may be close to baseline for her. I will contact her with the results of that study and otherwise see her back as needed.  Thank you again for the kind referral.  Chrystie Nose, MD, [facility] Attending Cardiologist [facility]  Chrystie Nose 03/03/2016, 8:52 AM

## 2016-03-03 NOTE — Patient Instructions (Signed)
Your physician has requested that you have an echocardiogram @ [address]. Echocardiography is a painless test that uses sound waves to create images of your heart. It provides your doctor with information about the size and shape of your heart and how well your heart's chambers and valves are working. This procedure takes approximately one hour. There are no restrictions for this procedure.    Your physician recommends that you schedule a follow-up appointment as needed. We will contact you with the results of your test.

## 2016-03-17 ENCOUNTER — Other Ambulatory Visit: Payer: Self-pay

## 2016-03-17 ENCOUNTER — Ambulatory Visit (HOSPITAL_COMMUNITY): Payer: 59 | Attending: Internal Medicine

## 2016-03-17 DIAGNOSIS — Z3A15 15 weeks gestation of pregnancy: Secondary | ICD-10-CM | POA: Diagnosis not present

## 2016-03-17 DIAGNOSIS — Z8249 Family history of ischemic heart disease and other diseases of the circulatory system: Secondary | ICD-10-CM | POA: Insufficient documentation

## 2016-03-17 DIAGNOSIS — I10 Essential (primary) hypertension: Secondary | ICD-10-CM

## 2016-03-17 DIAGNOSIS — R000 Tachycardia, unspecified: Secondary | ICD-10-CM

## 2016-03-17 DIAGNOSIS — O162 Unspecified maternal hypertension, second trimester: Secondary | ICD-10-CM | POA: Insufficient documentation

## 2016-03-17 DIAGNOSIS — Z3A20 20 weeks gestation of pregnancy: Secondary | ICD-10-CM | POA: Diagnosis not present

## 2016-06-07 ENCOUNTER — Observation Stay (HOSPITAL_COMMUNITY)
Admission: AD | Admit: 2016-06-07 | Discharge: 2016-06-08 | Disposition: A | Discharge status: Home / Self Care | Payer: 59 | Source: Ambulatory Visit | Attending: Obstetrics and Gynecology | Admitting: Obstetrics and Gynecology

## 2016-06-07 ENCOUNTER — Encounter (HOSPITAL_COMMUNITY): Payer: Self-pay

## 2016-06-07 ENCOUNTER — Observation Stay (HOSPITAL_COMMUNITY): Payer: 59

## 2016-06-07 DIAGNOSIS — O133 Gestational [pregnancy-induced] hypertension without significant proteinuria, third trimester: Secondary | ICD-10-CM | POA: Diagnosis not present

## 2016-06-07 DIAGNOSIS — Z3A30 30 weeks gestation of pregnancy: Secondary | ICD-10-CM | POA: Diagnosis not present

## 2016-06-07 DIAGNOSIS — O163 Unspecified maternal hypertension, third trimester: Secondary | ICD-10-CM

## 2016-06-07 DIAGNOSIS — Z7982 Long term (current) use of aspirin: Secondary | ICD-10-CM | POA: Insufficient documentation

## 2016-06-07 DIAGNOSIS — O99213 Obesity complicating pregnancy, third trimester: Secondary | ICD-10-CM

## 2016-06-07 DIAGNOSIS — Z79899 Other long term (current) drug therapy: Secondary | ICD-10-CM | POA: Diagnosis not present

## 2016-06-07 DIAGNOSIS — O4103X0 Oligohydramnios, third trimester, not applicable or unspecified: Principal | ICD-10-CM | POA: Insufficient documentation

## 2016-06-07 DIAGNOSIS — O4100X0 Oligohydramnios, unspecified trimester, not applicable or unspecified: Secondary | ICD-10-CM | POA: Diagnosis present

## 2016-06-07 DIAGNOSIS — Z3A32 32 weeks gestation of pregnancy: Secondary | ICD-10-CM

## 2016-06-07 LAB — CBC
HCT: 37 % (ref 36.0–46.0)
Hemoglobin: 12.3 g/dL (ref 12.0–15.0)
MCH: 27.3 pg (ref 26.0–34.0)
MCHC: 33.2 g/dL (ref 30.0–36.0)
MCV: 82 fL (ref 78.0–100.0)
PLATELETS: 242 10*3/uL (ref 150–400)
RBC: 4.51 MIL/uL (ref 3.87–5.11)
RDW: 14.4 % (ref 11.5–15.5)
WBC: 12.3 10*3/uL — AB (ref 4.0–10.5)

## 2016-06-07 LAB — COMPREHENSIVE METABOLIC PANEL
ALK PHOS: 113 U/L (ref 38–126)
ALT: 13 U/L — ABNORMAL LOW (ref 14–54)
ANION GAP: 9 (ref 5–15)
AST: 15 U/L (ref 15–41)
Albumin: 3 g/dL — ABNORMAL LOW (ref 3.5–5.0)
BUN: 7 mg/dL (ref 6–20)
CALCIUM: 8.6 mg/dL — AB (ref 8.9–10.3)
CO2: 21 mmol/L — AB (ref 22–32)
Chloride: 104 mmol/L (ref 101–111)
Creatinine, Ser: 0.46 mg/dL (ref 0.44–1.00)
Glucose, Bld: 84 mg/dL (ref 65–99)
Potassium: 3.2 mmol/L — ABNORMAL LOW (ref 3.5–5.1)
SODIUM: 134 mmol/L — AB (ref 135–145)
TOTAL PROTEIN: 6.6 g/dL (ref 6.5–8.1)
Total Bilirubin: 0.6 mg/dL (ref 0.3–1.2)

## 2016-06-07 LAB — URIC ACID: URIC ACID, SERUM: 4.8 mg/dL (ref 2.3–6.6)

## 2016-06-07 LAB — ABO/RH: ABO/RH(D): O POS

## 2016-06-07 LAB — TYPE AND SCREEN
ABO/RH(D): O POS
Antibody Screen: NEGATIVE

## 2016-06-07 MED ORDER — PRENATAL MULTIVITAMIN CH
1.0000 | ORAL_TABLET | Freq: Every day | ORAL | Status: DC
  Administered 2016-06-08: 1 via ORAL
  Filled 2016-06-07: qty 1

## 2016-06-07 MED ORDER — CALCIUM CARBONATE ANTACID 500 MG PO CHEW: 2.0000 | CHEWABLE_TABLET | ORAL | Status: DC | PRN

## 2016-06-07 MED ORDER — LACTATED RINGERS IV SOLN
INTRAVENOUS | Status: DC
  Administered 2016-06-07 – 2016-06-08 (×3): via INTRAVENOUS

## 2016-06-07 MED ORDER — DOCUSATE SODIUM 100 MG PO CAPS
100.0000 mg | ORAL_CAPSULE | Freq: Every day | ORAL | Status: DC
  Filled 2016-06-07: qty 1

## 2016-06-07 MED ORDER — HYDROCHLOROTHIAZIDE 25 MG PO TABS
25.0000 mg | ORAL_TABLET | Freq: Every day | ORAL | Status: DC
  Administered 2016-06-08: 25 mg via ORAL
  Filled 2016-06-07: qty 1

## 2016-06-07 MED ORDER — ZOLPIDEM TARTRATE 5 MG PO TABS: 5.0000 mg | ORAL_TABLET | Freq: Every evening | ORAL | Status: DC | PRN

## 2016-06-07 MED ORDER — ACETAMINOPHEN 325 MG PO TABS: 650.0000 mg | ORAL_TABLET | ORAL | Status: DC | PRN

## 2016-06-07 NOTE — H&P (Signed)
31 year old G 2 P 1011 at 30 weeks presents for management of AFI of 6 She was in the office today and had a routine BPP which was 8/8 but AFI of 6. Ultrasound last week showed AFI 23%.  She has chronic hypertension and is on medication. Her fetus has shown appropriate growth.  She has no complaints today.  Past Medical History:  Diagnosis Date  . Hypertension    gestational hypertension  . Molar pregnancy    Past Surgical History:  Procedure Laterality Date  . CHOLECYSTECTOMY N/A 11/22/2012   Procedure: LAPAROSCOPIC CHOLECYSTECTOMY WITH INTRAOPERATIVE CHOLANGIOGRAM;  Surgeon: Adolph Pollack, MD;  Location: [facility] OR;  Service: General;  Laterality: N/A;  . CLEFT LIP REPAIR    . DILATION AND CURETTAGE OF UTERUS  2011  . WISDOM TOOTH EXTRACTION     Prior to Admission medications   Medication Sig Start Date End Date Taking? Authorizing Provider  acetaminophen (TYLENOL) 325 MG tablet Take 2 tablets (650 mg total) by mouth every 6 (six) hours as needed. 11/23/12   Emina Riebock, NP  hydrochlorothiazide (HYDRODIURIL) 25 MG tablet Take 1 tablet by mouth daily. 02/11/16   Historical Provider, MD  loratadine (CLARITIN) 10 MG tablet Take 10 mg by mouth daily as needed for allergies.     Historical Provider, MD   Allergies Clarithromycin and Sulfur  Family History  Problem Relation Age of Onset  . Other Mother     varicose veins  . Hypothyroidism Mother   . Irritable bowel syndrome Mother   . Hypertension Mother   . Arthritis Mother   . Bursitis Mother   . Scoliosis Paternal Grandfather   . Cancer Paternal Grandfather     melanoma  . Hypertension Maternal Grandmother   . Hypertension Maternal Grandfather   . Seizures Other     nephew   Social History   Social History  . Marital status: Married    Spouse name: N/A  . Number of children: N/A  . Years of education: N/A   Social History Main Topics  . Smoking status: Never Smoker  . Smokeless tobacco: Never Used  . Alcohol  use Yes     Comment: occasionally   . Drug use: No  . Sexual activity: Yes    Birth control/ protection: Pill   Other Topics Concern  . Not on file   Social History Narrative  . No narrative on file   There were no vitals taken for this visit. General alert and oriented Lung CTAB Car RRR Abdomen is soft and non tender  AFI above  IMPRESSION Oligohydramnios Chronic Hypertension  PLAN: Admit overnight IV hydration Recheck AFI in am Check CBC, CMET NST Monitor BPS closely

## 2016-06-07 NOTE — Progress Notes (Signed)
Contractions every 3-4 minutes noted on monitor. Pt states she "feels there are contractions, but they are not painful." Contractions are mild upon palpation. Dr. Vincente Poli in the room at this time and is aware of the contractions. Will continue to monitor.

## 2016-06-08 ENCOUNTER — Observation Stay (HOSPITAL_COMMUNITY): Payer: 59

## 2016-06-08 DIAGNOSIS — O4103X0 Oligohydramnios, third trimester, not applicable or unspecified: Secondary | ICD-10-CM | POA: Diagnosis not present

## 2016-06-08 IMAGING — US US MFM OB LIMITED
1 series · 15 of 28 positions shown · non-contrast
Comparison: none

[Series 1: us mfm ob limited · 32 acquisitions, 15 frames shown]
[im 1/32]
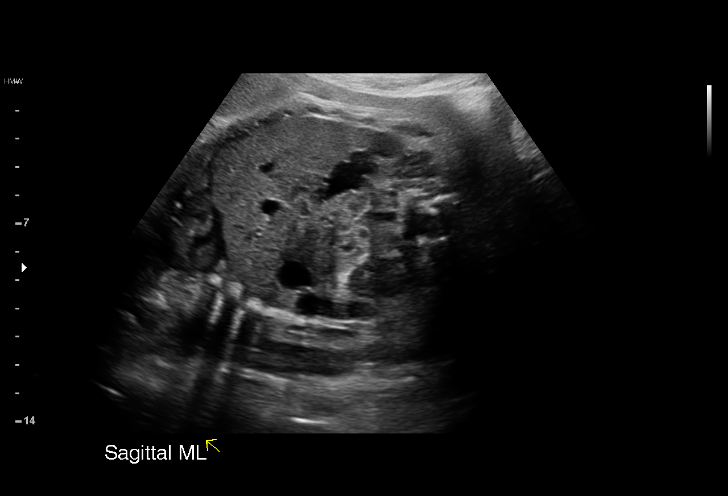
[im 3/32]
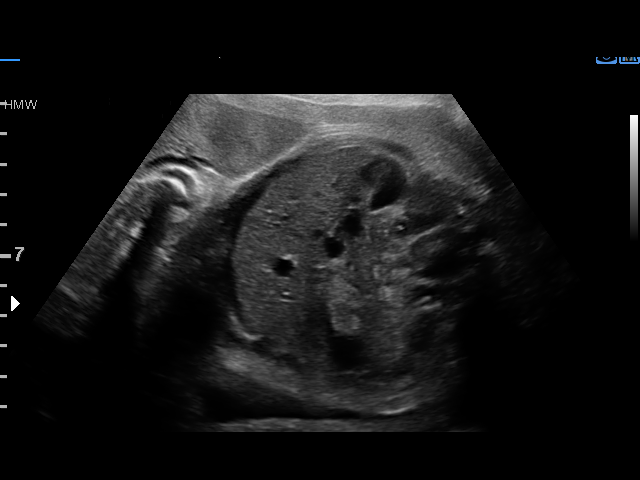
[im 5/32]
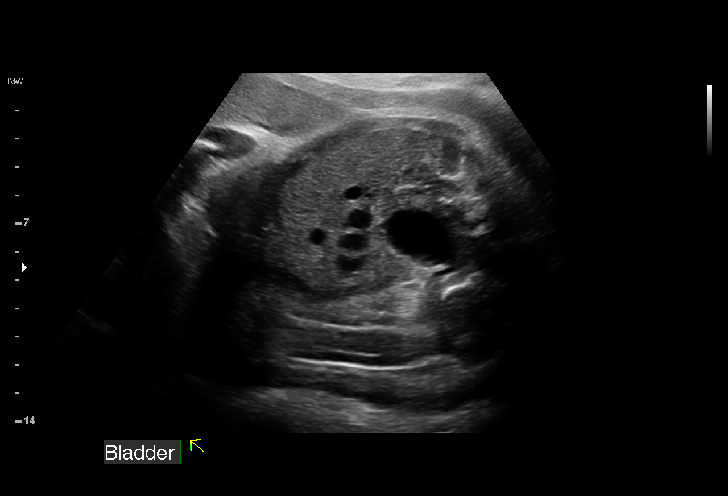
[im 7/32]
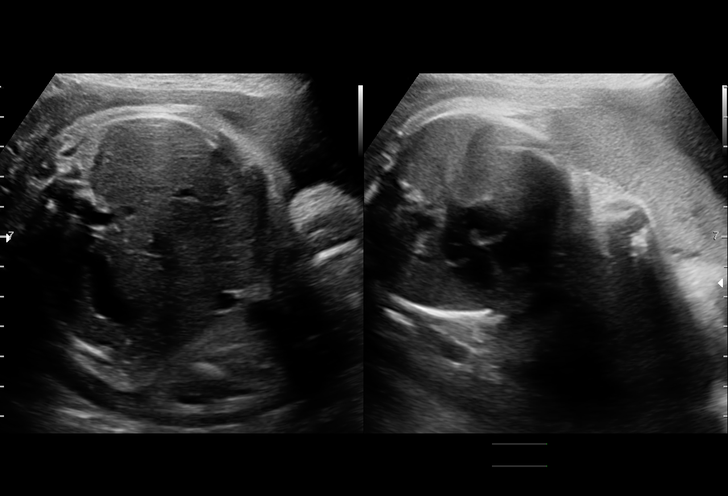
[im 10/32]
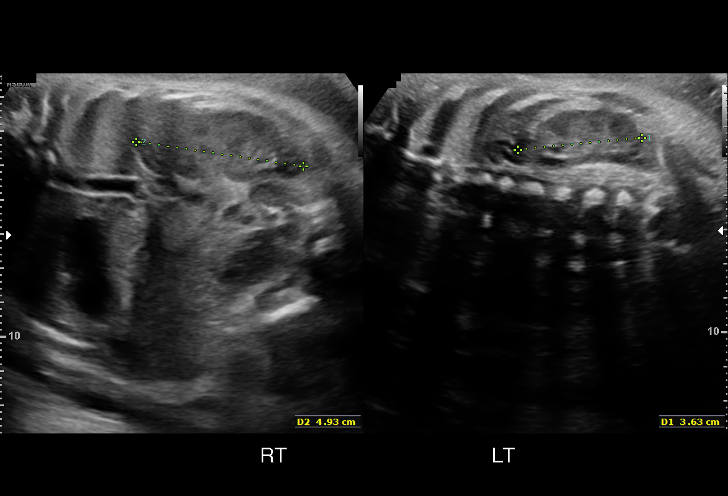
[im 12/32]
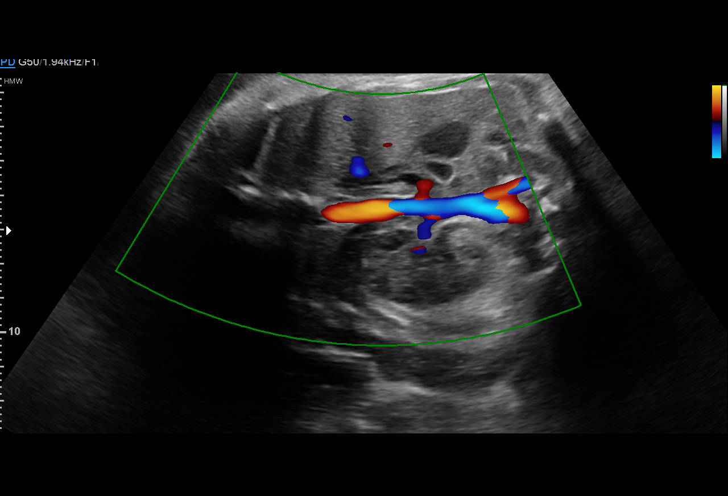
[im 14/32]
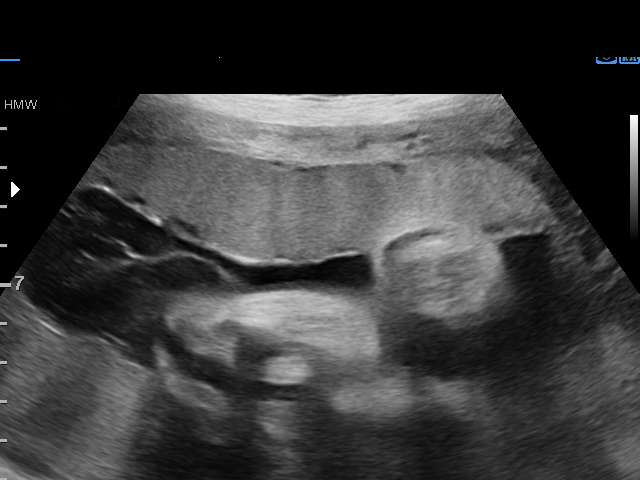
[im 17/32]
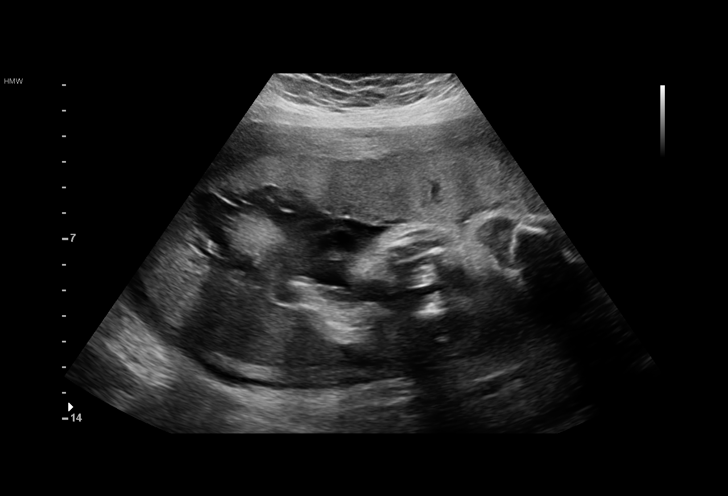
[im 18/32]
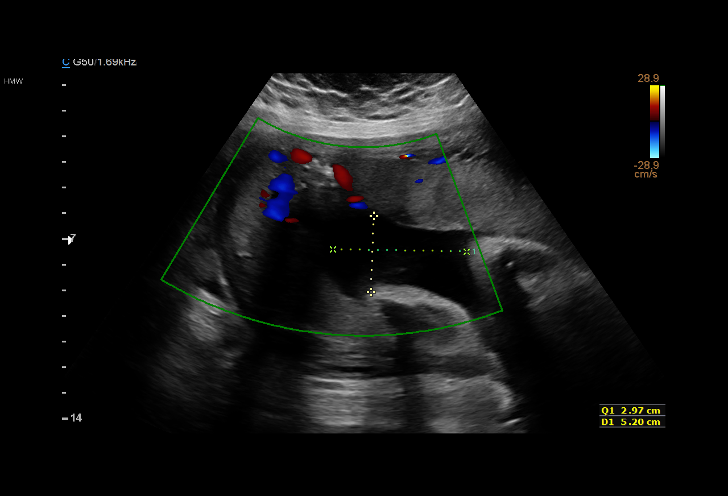
[im 20/32]
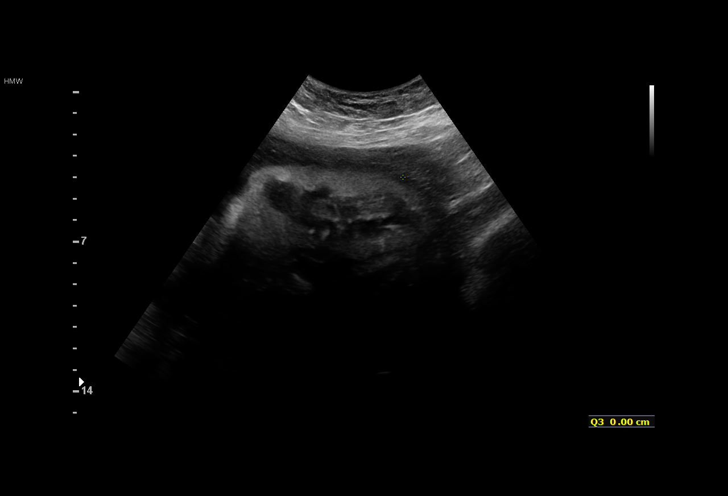
[im 22/32]
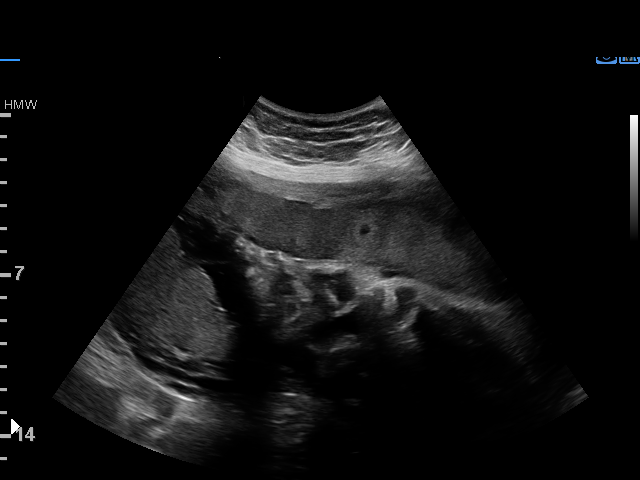
[im 25/32]
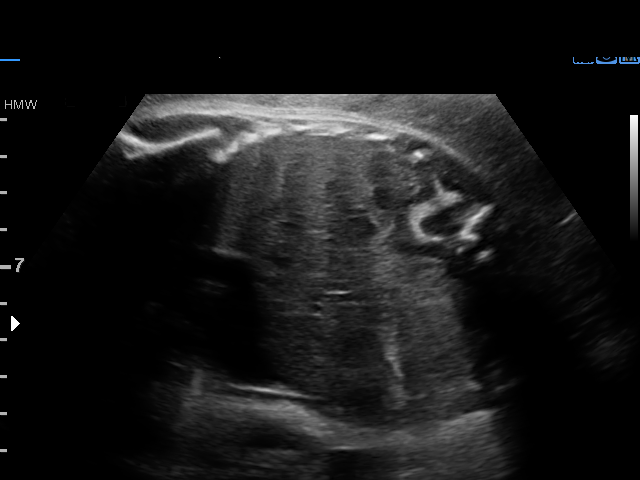
[im 27/32]
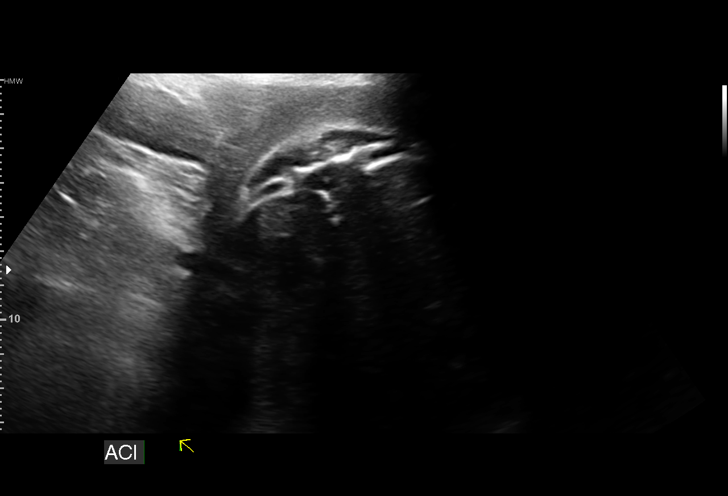
[im 29/32]
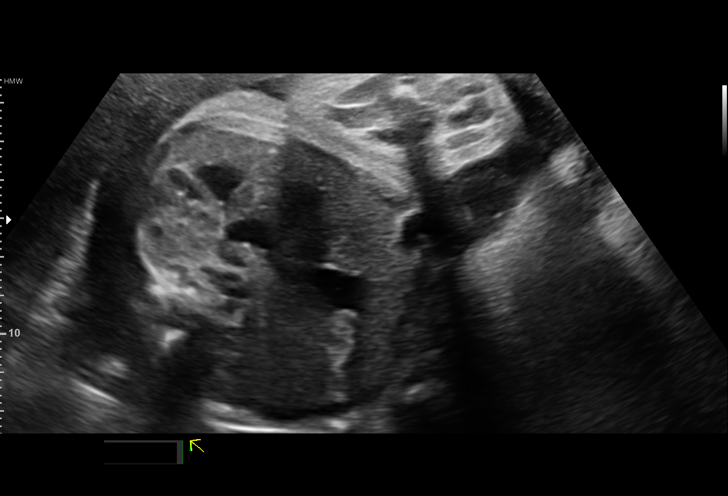
[im 32/32]
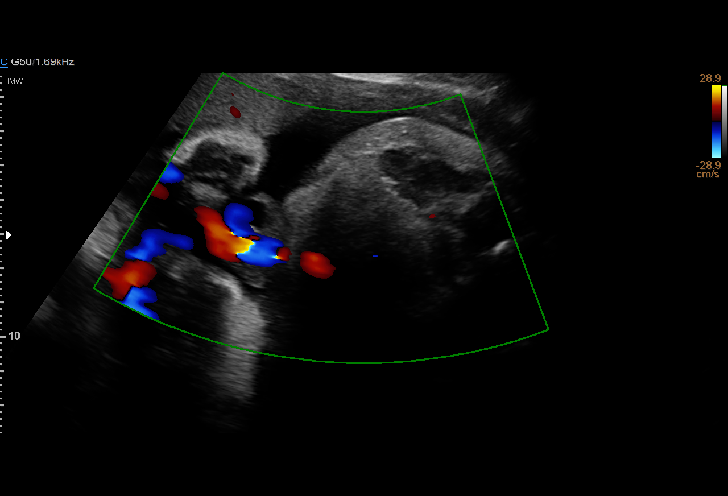

[15 of 28 positions shown; findings below may reference images not displayed]

[4L] KANDIS
KANDIS [HOSPITAL]

1  KANDIS          [PHONE_NUMBER]      [PHONE_NUMBER]     [PHONE_NUMBER]
Indications

32 weeks gestation of pregnancy
Oligohydramnios / Decreased amniotic fluid     [4L]
volume
Obesity complicating pregnancy                 [4L] [4L]
Hypertension - Chronic/Pre-existing on HCTZ    [4L]
OB History

Blood Type:            Height:  5'4"   Weight (lb):  194      BMI:
Gravidity:    3         Term:   1        Prem:   0        SAB:   1
TOP:          0       Ectopic:  0        Living: 1
Fetal Evaluation

Num Of Fetuses:     1
Fetal Heart         167
Rate(bpm):
Cardiac Activity:   Observed
Presentation:       Breech
Placenta:           Anterior, above cervical os

Amniotic Fluid
AFI FV:      Subjectively: low normal range

AFI Sum(cm)     %Tile       Largest Pocket(cm)
5.41            < 3

RUQ(cm)       RLQ(cm)       LUQ(cm)        LLQ(cm)
2.97          0.8           1.64           0
Gestational Age

Clinical EDD:  32w 3d                                        EDD:   [DATE]
Best:          32w 3d    Det. By:   Clinical EDD             EDD:   [DATE]
Anatomy

Heart:                 Appears normal         Cord Vessels:           Appears normal (3
(4CH, axis, and situs                          vessel cord)
Stomach:               Appears normal, left   Kidneys:                Appear normal
sided
Abdomen:               Appears normal         Bladder:                Appears normal
Impression

SIUP at 32+2 weeks
Breech presentation
Kidneys and bladder visualized and appeared normal
Subjectively: low normal amniotic fluid volume
Objectively: AFI 5.4 - low normal AFV
(BPP [DATE])
Recommendations

Discussed with Dr. KANDIS
- Switch antihypertensive to Labetalol
- If NSTs continue to be reactive, consider following as oupt
- Encourage PO water intake
- Follow AFV 
 weekly; continue to monitor as outpt as long
as AFI 5 or > OR single vertical pocket of 2 x 1 cms or greater
- Continue serial growth ultrasounds every 3-4 weeks
- Deliver by 39+0 weeks

## 2016-06-08 MED ORDER — LABETALOL HCL 200 MG PO TABS: 200.0000 mg | ORAL_TABLET | Freq: Two times a day (BID) | ORAL | Status: DC

## 2016-06-08 MED ORDER — POTASSIUM CHLORIDE CRYS ER 20 MEQ PO TBCR
40.0000 meq | EXTENDED_RELEASE_TABLET | Freq: Two times a day (BID) | ORAL | Status: DC
  Administered 2016-06-08: 40 meq via ORAL
  Filled 2016-06-08 (×2): qty 2

## 2016-06-08 MED ORDER — ASPIRIN EC 81 MG PO TBEC
81.0000 mg | DELAYED_RELEASE_TABLET | Freq: Every day | ORAL | Status: DC
  Administered 2016-06-08: 81 mg via ORAL
  Filled 2016-06-08 (×2): qty 1

## 2016-06-08 MED ORDER — POTASSIUM CHLORIDE 20 MEQ PO PACK
40.0000 meq | PACK | Freq: Two times a day (BID) | ORAL | Status: DC
  Filled 2016-06-08: qty 2

## 2016-06-08 MED ORDER — LABETALOL HCL 200 MG PO TABS: 200.0000 mg | ORAL_TABLET | Freq: Two times a day (BID) | ORAL | 1 refills | Status: DC

## 2016-06-08 NOTE — Progress Notes (Addendum)
Pt w/out complaints, + FM Discussed [place] and patient w/ Dr Sherrie George She agrees with d/c of HCTZ and change to labetalol BPP 8/8 today, ok for outpatient mngt with weekly AFI

## 2016-06-08 NOTE — Progress Notes (Signed)
Pt given discharge instructions, questions answered, states understanding, signed and given copy  

## 2016-06-08 NOTE — Discharge Summary (Signed)
Physician Discharge Summary  Patient ID: Destiny Pena MRN: [id number] DOB/AGE: [birth year] 31 y.o.  Admit date: 06/07/2016 Discharge date: 06/08/2016  Admission Diagnoses: low AFI  Discharge Diagnoses:  Low AFI, CHTN  Discharged Condition: stable  Hospital Course: Pt admitted for labs, rest and hydration.  Repeat US with MFM consult was done - AFI stable with 2cm pocket.  Dr Sherrie George recommends outpatient management with weekly AFI.  Will change HCTZ to labetalol and follow up in office 2 days.  Consults: MFM  Significant Diagnostic Studies: labs: cbc, cmet  Treatments: fetal monitoring, fetal ultrasound  Discharge Exam: Blood pressure 115/72, pulse (!) 102, temperature 97.8 F (36.6 C), temperature source Oral, resp. rate 18, height  (1.626 m), weight 194 lb 12 oz (88.3 kg), SpO2 96 %. General appearance: alert and cooperative GI: normal findings: gravid, NT  Disposition: 01-Home or Self Care  Discharge Instructions    Diet - low sodium heart healthy    Complete by:  As directed    Increase activity slowly    Complete by:  As directed      Allergies as of 06/08/2016      Reactions   Clarithromycin Hives, Itching   Biaxin   Sulfur Rash   High fever       Medication List    STOP taking these medications   hydrochlorothiazide 25 MG tablet Commonly known as:  HYDRODIURIL     TAKE these medications   aspirin EC 81 MG tablet Take 81 mg by mouth daily.   labetalol 200 MG tablet Commonly known as:  NORMODYNE Take 1 tablet (200 mg total) by mouth 2 (two) times daily. Start taking on:  06/09/2016   prenatal multivitamin Tabs tablet Take 1 tablet by mouth daily at 12 noon.      Follow-up Information    [facility]. Schedule an appointment as soon as possible for a visit in 2 day(s).   Contact information: [address] [place] [address] [phone]           Signed: Zelphia Cairo 06/08/2016, 6:39  PM

## 2016-06-08 NOTE — Progress Notes (Signed)
Pt denies pain, ctx and LOF.  No VB.  + FM  AF, VSS +FHT Abd - soft, NT Ext - NT, no edema  Results for orders placed or performed during the hospital encounter of 06/07/16 (from the past 24 hour(s))  CBC on admission     Status: Abnormal   Collection Time: 06/07/16  5:40 PM  Result Value Ref Range   WBC 12.3 (H) 4.0 - 10.5 K/uL   RBC 4.51 3.87 - 5.11 MIL/uL   Hemoglobin 12.3 12.0 - 15.0 g/dL   HCT 16.1 09.6 - 04.5 %   MCV 82.0 78.0 - 100.0 fL   MCH 27.3 26.0 - 34.0 pg   MCHC 33.2 30.0 - 36.0 g/dL   RDW 40.9 81.1 - 91.4 %   Platelets 242 150 - 400 K/uL  Type and screen [facility]     Status: None   Collection Time: 06/07/16  5:40 PM  Result Value Ref Range   ABO/RH(D) O POS    Antibody Screen NEG    Sample Expiration 06/10/2016   Comprehensive metabolic panel     Status: Abnormal   Collection Time: 06/07/16  5:40 PM  Result Value Ref Range   Sodium 134 (L) 135 - 145 mmol/L   Potassium 3.2 (L) 3.5 - 5.1 mmol/L   Chloride 104 101 - 111 mmol/L   CO2 21 (L) 22 - 32 mmol/L   Glucose, Bld 84 65 - 99 mg/dL   BUN 7 6 - 20 mg/dL   Creatinine, Ser 7.82 0.44 - 1.00 mg/dL   Calcium 8.6 (L) 8.9 - 10.3 mg/dL   Total Protein 6.6 6.5 - 8.1 g/dL   Albumin 3.0 (L) 3.5 - 5.0 g/dL   AST 15 15 - 41 U/L   ALT 13 (L) 14 - 54 U/L   Alkaline Phosphatase 113 38 - 126 U/L   Total Bilirubin 0.6 0.3 - 1.2 mg/dL   GFR calc non Af Amer >60 >60 mL/min   GFR calc Af Amer >60 >60 mL/min   Anion gap 9 5 - 15  Uric acid     Status: None   Collection Time: 06/07/16  5:40 PM  Result Value Ref Range   Uric Acid, Serum 4.8 2.3 - 6.6 mg/dL  ABO/Rh     Status: None   Collection Time: 06/07/16  5:40 PM  Result Value Ref Range   ABO/RH(D) O POS     A/P:  32+3 weeks with oligohydramnios Repeat US this am - report pending.  BPP 8/8 CHTN - currently on HCTZ /d with good BP control.  Consider change to labetalol in light of oligo. Hypokalemia - will order replacement Await MFM  recs, consult ordered

## 2016-06-17 ENCOUNTER — Inpatient Hospital Stay (HOSPITAL_COMMUNITY)
Admission: AD | Admit: 2016-06-17 | Discharge: 2016-06-17 | Disposition: A | Discharge status: Home / Self Care | Payer: 59 | Source: Ambulatory Visit | Attending: Obstetrics and Gynecology | Admitting: Obstetrics and Gynecology

## 2016-06-17 DIAGNOSIS — Z3483 Encounter for supervision of other normal pregnancy, third trimester: Secondary | ICD-10-CM | POA: Insufficient documentation

## 2016-06-17 DIAGNOSIS — Z3A35 35 weeks gestation of pregnancy: Secondary | ICD-10-CM | POA: Diagnosis not present

## 2016-06-17 MED ORDER — BETAMETHASONE SOD PHOS & ACET 6 (3-3) MG/ML IJ SUSP
12.0000 mg | INTRAMUSCULAR | Status: DC
  Administered 2016-06-17: 12 mg via INTRAMUSCULAR
  Filled 2016-06-17: qty 2

## 2016-06-17 NOTE — MAU Note (Signed)
Pt presents to MAU for BMZ injection. Dr Marcelle Overlie called for orders

## 2016-06-18 ENCOUNTER — Inpatient Hospital Stay (HOSPITAL_COMMUNITY)
Admission: AD | Admit: 2016-06-18 | Discharge: 2016-06-18 | Disposition: A | Discharge status: Home / Self Care | Payer: 59 | Source: Ambulatory Visit | Attending: Obstetrics and Gynecology | Admitting: Obstetrics and Gynecology

## 2016-06-18 DIAGNOSIS — Z3493 Encounter for supervision of normal pregnancy, unspecified, third trimester: Secondary | ICD-10-CM | POA: Insufficient documentation

## 2016-06-18 DIAGNOSIS — Z3A35 35 weeks gestation of pregnancy: Secondary | ICD-10-CM | POA: Diagnosis not present

## 2016-06-18 MED ORDER — BETAMETHASONE SOD PHOS & ACET 6 (3-3) MG/ML IJ SUSP
12.0000 mg | INTRAMUSCULAR | Status: DC
  Administered 2016-06-18: 12 mg via INTRAMUSCULAR
  Filled 2016-06-18: qty 2

## 2016-07-04 ENCOUNTER — Encounter (HOSPITAL_COMMUNITY): Payer: Self-pay | Admitting: *Deleted

## 2016-07-04 ENCOUNTER — Inpatient Hospital Stay (HOSPITAL_COMMUNITY): Payer: 59

## 2016-07-04 ENCOUNTER — Inpatient Hospital Stay (HOSPITAL_COMMUNITY)
Admission: AD | Admit: 2016-07-04 | Discharge: 2016-07-06 | DRG: 765 | Disposition: A | Discharge status: Home / Self Care | Payer: 59 | Source: Ambulatory Visit | Attending: Obstetrics & Gynecology | Admitting: Obstetrics & Gynecology

## 2016-07-04 ENCOUNTER — Encounter (HOSPITAL_COMMUNITY): Payer: Self-pay | Admitting: Anesthesiology

## 2016-07-04 ENCOUNTER — Encounter (HOSPITAL_COMMUNITY)
Admission: AD | Disposition: A | Discharge status: Home / Self Care | Payer: Self-pay | Source: Ambulatory Visit | Attending: Obstetrics & Gynecology

## 2016-07-04 DIAGNOSIS — Z3A36 36 weeks gestation of pregnancy: Secondary | ICD-10-CM

## 2016-07-04 DIAGNOSIS — R1012 Left upper quadrant pain: Secondary | ICD-10-CM | POA: Diagnosis present

## 2016-07-04 DIAGNOSIS — O321XX0 Maternal care for breech presentation, not applicable or unspecified: Principal | ICD-10-CM | POA: Diagnosis present

## 2016-07-04 DIAGNOSIS — O4103X0 Oligohydramnios, third trimester, not applicable or unspecified: Secondary | ICD-10-CM | POA: Diagnosis present

## 2016-07-04 DIAGNOSIS — O4100X0 Oligohydramnios, unspecified trimester, not applicable or unspecified: Secondary | ICD-10-CM | POA: Diagnosis present

## 2016-07-04 DIAGNOSIS — O26899 Other specified pregnancy related conditions, unspecified trimester: Secondary | ICD-10-CM

## 2016-07-04 DIAGNOSIS — Z302 Encounter for sterilization: Secondary | ICD-10-CM

## 2016-07-04 DIAGNOSIS — O1002 Pre-existing essential hypertension complicating childbirth: Secondary | ICD-10-CM | POA: Diagnosis present

## 2016-07-04 DIAGNOSIS — Z98891 History of uterine scar from previous surgery: Secondary | ICD-10-CM

## 2016-07-04 DIAGNOSIS — R109 Unspecified abdominal pain: Secondary | ICD-10-CM

## 2016-07-04 HISTORY — DX: Cleft lip, unilateral: Q36.9

## 2016-07-04 LAB — CBC
HCT: 39.3 % (ref 36.0–46.0)
Hemoglobin: 13.3 g/dL (ref 12.0–15.0)
MCH: 27.9 pg (ref 26.0–34.0)
MCHC: 33.8 g/dL (ref 30.0–36.0)
MCV: 82.4 fL (ref 78.0–100.0)
PLATELETS: 239 10*3/uL (ref 150–400)
RBC: 4.77 MIL/uL (ref 3.87–5.11)
RDW: 15 % (ref 11.5–15.5)
WBC: 12.8 10*3/uL — ABNORMAL HIGH (ref 4.0–10.5)

## 2016-07-04 LAB — URINALYSIS, ROUTINE W REFLEX MICROSCOPIC
Bilirubin Urine: NEGATIVE
Glucose, UA: NEGATIVE mg/dL
HGB URINE DIPSTICK: NEGATIVE
Ketones, ur: NEGATIVE mg/dL
Leukocytes, UA: NEGATIVE
Nitrite: NEGATIVE
Protein, ur: NEGATIVE mg/dL
Specific Gravity, Urine: 1.008 (ref 1.005–1.030)
pH: 7 (ref 5.0–8.0)

## 2016-07-04 LAB — TYPE AND SCREEN
ABO/RH(D): O POS
Antibody Screen: NEGATIVE

## 2016-07-04 SURGERY — Surgical Case
Anesthesia: Spinal | Site: Abdomen | Wound class: Clean Contaminated

## 2016-07-04 MED ORDER — KETOROLAC TROMETHAMINE 30 MG/ML IJ SOLN
INTRAMUSCULAR | Status: AC
  Filled 2016-07-04: qty 1

## 2016-07-04 MED ORDER — KETOROLAC TROMETHAMINE 30 MG/ML IJ SOLN
30.0000 mg | Freq: Four times a day (QID) | INTRAMUSCULAR | Status: AC | PRN
  Administered 2016-07-04: 30 mg via INTRAMUSCULAR

## 2016-07-04 MED ORDER — NALBUPHINE HCL 10 MG/ML IJ SOLN: 5.0000 mg | Freq: Once | INTRAMUSCULAR | Status: DC | PRN

## 2016-07-04 MED ORDER — OXYCODONE-ACETAMINOPHEN 5-325 MG PO TABS: 2.0000 | ORAL_TABLET | ORAL | Status: DC | PRN

## 2016-07-04 MED ORDER — NALOXONE HCL 0.4 MG/ML IJ SOLN: 0.4000 mg | INTRAMUSCULAR | Status: DC | PRN

## 2016-07-04 MED ORDER — OXYTOCIN 40 UNITS IN LACTATED RINGERS INFUSION - SIMPLE MED: 2.5000 [IU]/h | INTRAVENOUS | Status: AC

## 2016-07-04 MED ORDER — DIBUCAINE 1 % RE OINT: 1.0000 "application " | TOPICAL_OINTMENT | RECTAL | Status: DC | PRN

## 2016-07-04 MED ORDER — NALBUPHINE HCL 10 MG/ML IJ SOLN: 5.0000 mg | INTRAMUSCULAR | Status: DC | PRN

## 2016-07-04 MED ORDER — MORPHINE SULFATE (PF) 0.5 MG/ML IJ SOLN
INTRAMUSCULAR | Status: AC
  Filled 2016-07-04: qty 10

## 2016-07-04 MED ORDER — FAMOTIDINE IN NACL 20-0.9 MG/50ML-% IV SOLN
20.0000 mg | Freq: Once | INTRAVENOUS | Status: AC
  Administered 2016-07-04: 20 mg via INTRAVENOUS
  Filled 2016-07-04: qty 50

## 2016-07-04 MED ORDER — LACTATED RINGERS IV SOLN: INTRAVENOUS | Status: DC

## 2016-07-04 MED ORDER — LACTATED RINGERS IV SOLN
INTRAVENOUS | Status: DC
  Administered 2016-07-04: 125 mL/h via INTRAVENOUS

## 2016-07-04 MED ORDER — SIMETHICONE 80 MG PO CHEW
80.0000 mg | CHEWABLE_TABLET | ORAL | Status: DC
  Administered 2016-07-05: 80 mg via ORAL
  Filled 2016-07-04 (×2): qty 1

## 2016-07-04 MED ORDER — OXYCODONE HCL 5 MG/5ML PO SOLN: 5.0000 mg | Freq: Once | ORAL | Status: DC | PRN

## 2016-07-04 MED ORDER — LACTATED RINGERS IV SOLN
INTRAVENOUS | Status: DC | PRN
  Administered 2016-07-04: 15:00:00 via INTRAVENOUS

## 2016-07-04 MED ORDER — SOD CITRATE-CITRIC ACID 500-334 MG/5ML PO SOLN
30.0000 mL | Freq: Once | ORAL | Status: AC
  Administered 2016-07-04: 30 mL via ORAL
  Filled 2016-07-04: qty 15

## 2016-07-04 MED ORDER — NALOXONE HCL 2 MG/2ML IJ SOSY: 1.0000 ug/kg/h | PREFILLED_SYRINGE | INTRAVENOUS | Status: DC | PRN

## 2016-07-04 MED ORDER — SENNOSIDES-DOCUSATE SODIUM 8.6-50 MG PO TABS
2.0000 | ORAL_TABLET | ORAL | Status: DC
  Administered 2016-07-05 (×2): 2 via ORAL
  Filled 2016-07-04 (×2): qty 2

## 2016-07-04 MED ORDER — ONDANSETRON HCL 4 MG/2ML IJ SOLN: 4.0000 mg | Freq: Four times a day (QID) | INTRAMUSCULAR | Status: DC | PRN

## 2016-07-04 MED ORDER — BUPIVACAINE IN DEXTROSE 0.75-8.25 % IT SOLN
INTRATHECAL | Status: DC | PRN
  Administered 2016-07-04: 1.6 mL via INTRATHECAL

## 2016-07-04 MED ORDER — WITCH HAZEL-GLYCERIN EX PADS: 1.0000 "application " | MEDICATED_PAD | CUTANEOUS | Status: DC | PRN

## 2016-07-04 MED ORDER — HYDROMORPHONE HCL 1 MG/ML IJ SOLN: 0.2500 mg | INTRAMUSCULAR | Status: DC | PRN

## 2016-07-04 MED ORDER — KETOROLAC TROMETHAMINE 30 MG/ML IJ SOLN: 30.0000 mg | Freq: Four times a day (QID) | INTRAMUSCULAR | Status: AC | PRN

## 2016-07-04 MED ORDER — MORPHINE SULFATE (PF) 0.5 MG/ML IJ SOLN
INTRAMUSCULAR | Status: DC | PRN
Start: 2016-07-04 — End: 2016-07-04
  Administered 2016-07-04: .2 mg via INTRATHECAL

## 2016-07-04 MED ORDER — DIPHENHYDRAMINE HCL 25 MG PO CAPS
25.0000 mg | ORAL_CAPSULE | ORAL | Status: DC | PRN
Start: 2016-07-04 — End: 2016-07-06

## 2016-07-04 MED ORDER — PHENYLEPHRINE HCL 10 MG/ML IJ SOLN
INTRAMUSCULAR | Status: DC | PRN
  Administered 2016-07-04 (×4): 40 ug via INTRAVENOUS

## 2016-07-04 MED ORDER — OXYTOCIN 10 UNIT/ML IJ SOLN
INTRAMUSCULAR | Status: AC
  Filled 2016-07-04: qty 4

## 2016-07-04 MED ORDER — PHENYLEPHRINE 40 MCG/ML (10ML) SYRINGE FOR IV PUSH (FOR BLOOD PRESSURE SUPPORT)
PREFILLED_SYRINGE | INTRAVENOUS | Status: AC
  Filled 2016-07-04: qty 10

## 2016-07-04 MED ORDER — SIMETHICONE 80 MG PO CHEW
80.0000 mg | CHEWABLE_TABLET | Freq: Three times a day (TID) | ORAL | Status: DC
  Administered 2016-07-04 – 2016-07-06 (×4): 80 mg via ORAL
  Filled 2016-07-04 (×3): qty 1

## 2016-07-04 MED ORDER — SCOPOLAMINE 1 MG/3DAYS TD PT72
1.0000 | MEDICATED_PATCH | Freq: Once | TRANSDERMAL | Status: DC
  Administered 2016-07-04: 1.5 mg via TRANSDERMAL

## 2016-07-04 MED ORDER — OXYCODONE-ACETAMINOPHEN 5-325 MG PO TABS
1.0000 | ORAL_TABLET | ORAL | Status: DC | PRN
  Administered 2016-07-05: 1 via ORAL
  Filled 2016-07-04: qty 1

## 2016-07-04 MED ORDER — FENTANYL CITRATE (PF) 100 MCG/2ML IJ SOLN
INTRAMUSCULAR | Status: AC
  Filled 2016-07-04: qty 2

## 2016-07-04 MED ORDER — PHENYLEPHRINE 8 MG IN D5W 100 ML (0.08MG/ML) PREMIX OPTIME
INJECTION | INTRAVENOUS | Status: DC | PRN
  Administered 2016-07-04: 60 ug/min via INTRAVENOUS

## 2016-07-04 MED ORDER — ONDANSETRON HCL 4 MG/2ML IJ SOLN
INTRAMUSCULAR | Status: DC | PRN
  Administered 2016-07-04: 4 mg via INTRAVENOUS

## 2016-07-04 MED ORDER — SIMETHICONE 80 MG PO CHEW: 80.0000 mg | CHEWABLE_TABLET | ORAL | Status: DC | PRN

## 2016-07-04 MED ORDER — IBUPROFEN 600 MG PO TABS
600.0000 mg | ORAL_TABLET | Freq: Four times a day (QID) | ORAL | Status: DC
  Administered 2016-07-04 – 2016-07-06 (×6): 600 mg via ORAL
  Filled 2016-07-04 (×6): qty 1

## 2016-07-04 MED ORDER — MEPERIDINE HCL 25 MG/ML IJ SOLN: 6.2500 mg | INTRAMUSCULAR | Status: DC | PRN

## 2016-07-04 MED ORDER — ONDANSETRON HCL 4 MG/2ML IJ SOLN: 4.0000 mg | Freq: Three times a day (TID) | INTRAMUSCULAR | Status: DC | PRN

## 2016-07-04 MED ORDER — BUPIVACAINE IN DEXTROSE 0.75-8.25 % IT SOLN
INTRATHECAL | Status: AC
  Filled 2016-07-04: qty 2

## 2016-07-04 MED ORDER — FENTANYL CITRATE (PF) 100 MCG/2ML IJ SOLN
INTRAMUSCULAR | Status: DC | PRN
  Administered 2016-07-04: 10 ug via INTRATHECAL

## 2016-07-04 MED ORDER — TETANUS-DIPHTH-ACELL PERTUSSIS 5-2.5-18.5 LF-MCG/0.5 IM SUSP: 0.5000 mL | Freq: Once | INTRAMUSCULAR | Status: DC

## 2016-07-04 MED ORDER — MENTHOL 3 MG MT LOZG: 1.0000 | LOZENGE | OROMUCOSAL | Status: DC | PRN

## 2016-07-04 MED ORDER — LACTATED RINGERS IV BOLUS (SEPSIS): 1000.0000 mL | Freq: Once | INTRAVENOUS | Status: DC

## 2016-07-04 MED ORDER — ACETAMINOPHEN 325 MG PO TABS: 650.0000 mg | ORAL_TABLET | ORAL | Status: DC | PRN

## 2016-07-04 MED ORDER — ZOLPIDEM TARTRATE 5 MG PO TABS: 5.0000 mg | ORAL_TABLET | Freq: Every evening | ORAL | Status: DC | PRN

## 2016-07-04 MED ORDER — DIPHENHYDRAMINE HCL 50 MG/ML IJ SOLN: 12.5000 mg | INTRAMUSCULAR | Status: DC | PRN

## 2016-07-04 MED ORDER — OXYTOCIN 10 UNIT/ML IJ SOLN: INTRAVENOUS | Status: DC | PRN

## 2016-07-04 MED ORDER — LACTATED RINGERS IV SOLN
INTRAVENOUS | Status: DC
  Administered 2016-07-04 (×2): via INTRAVENOUS

## 2016-07-04 MED ORDER — SCOPOLAMINE 1 MG/3DAYS TD PT72
MEDICATED_PATCH | TRANSDERMAL | Status: AC
  Filled 2016-07-04: qty 1

## 2016-07-04 MED ORDER — CEFAZOLIN SODIUM-DEXTROSE 2-4 GM/100ML-% IV SOLN
2.0000 g | INTRAVENOUS | Status: AC
  Administered 2016-07-04: 2 g via INTRAVENOUS
  Filled 2016-07-04: qty 100

## 2016-07-04 MED ORDER — OXYTOCIN 10 UNIT/ML IJ SOLN
INTRAVENOUS | Status: DC | PRN
  Administered 2016-07-04: 40 [IU] via INTRAVENOUS

## 2016-07-04 MED ORDER — DIPHENHYDRAMINE HCL 25 MG PO CAPS: 25.0000 mg | ORAL_CAPSULE | Freq: Four times a day (QID) | ORAL | Status: DC | PRN

## 2016-07-04 MED ORDER — SODIUM CHLORIDE 0.9 % IR SOLN
Status: DC | PRN
  Administered 2016-07-04: 1

## 2016-07-04 MED ORDER — PRENATAL MULTIVITAMIN CH
1.0000 | ORAL_TABLET | Freq: Every day | ORAL | Status: DC
  Administered 2016-07-05: 1 via ORAL
  Filled 2016-07-04: qty 1

## 2016-07-04 MED ORDER — OXYCODONE HCL 5 MG PO TABS: 5.0000 mg | ORAL_TABLET | Freq: Once | ORAL | Status: DC | PRN

## 2016-07-04 MED ORDER — SODIUM CHLORIDE 0.9% FLUSH: 3.0000 mL | INTRAVENOUS | Status: DC | PRN

## 2016-07-04 MED ORDER — COCONUT OIL OIL: 1.0000 "application " | TOPICAL_OIL | Status: DC | PRN

## 2016-07-04 SURGICAL SUPPLY — 33 items
BENZOIN TINCTURE PRP APPL 2/3 (GAUZE/BANDAGES/DRESSINGS) ×3 IMPLANT
CHLORAPREP W/TINT 26ML (MISCELLANEOUS) ×3 IMPLANT
CLAMP CORD UMBIL (MISCELLANEOUS) IMPLANT
CLOSURE STERI STRIP 1/2 X4 (GAUZE/BANDAGES/DRESSINGS) ×3 IMPLANT
CLOSURE WOUND 1/2 X4 (GAUZE/BANDAGES/DRESSINGS)
CLOTH BEACON ORANGE TIMEOUT ST (SAFETY) ×3 IMPLANT
DERMABOND ADVANCED (GAUZE/BANDAGES/DRESSINGS)
DERMABOND ADVANCED .7 DNX12 (GAUZE/BANDAGES/DRESSINGS) IMPLANT
DRSG OPSITE POSTOP 4X10 (GAUZE/BANDAGES/DRESSINGS) ×3 IMPLANT
ELECT REM PT RETURN 9FT ADLT (ELECTROSURGICAL) ×3
ELECTRODE REM PT RTRN 9FT ADLT (ELECTROSURGICAL) ×1 IMPLANT
EXTRACTOR VACUUM KIWI (MISCELLANEOUS) IMPLANT
GLOVE BIO SURGEON STRL SZ 6 (GLOVE) ×3 IMPLANT
GLOVE BIOGEL PI IND STRL 6 (GLOVE) ×2 IMPLANT
GLOVE BIOGEL PI IND STRL 7.0 (GLOVE) ×1 IMPLANT
GLOVE BIOGEL PI INDICATOR 6 (GLOVE) ×4
GLOVE BIOGEL PI INDICATOR 7.0 (GLOVE) ×2
GOWN STRL REUS W/TWL LRG LVL3 (GOWN DISPOSABLE) ×6 IMPLANT
KIT ABG SYR 3ML LUER SLIP (SYRINGE) ×3 IMPLANT
NEEDLE HYPO 25X5/8 SAFETYGLIDE (NEEDLE) ×3 IMPLANT
NS IRRIG 1000ML POUR BTL (IV SOLUTION) ×3 IMPLANT
PACK C SECTION WH (CUSTOM PROCEDURE TRAY) ×3 IMPLANT
PAD OB MATERNITY 4.3X12.25 (PERSONAL CARE ITEMS) ×3 IMPLANT
PENCIL SMOKE EVAC W/HOLSTER (ELECTROSURGICAL) ×3 IMPLANT
STRIP CLOSURE SKIN 1/2X4 (GAUZE/BANDAGES/DRESSINGS) IMPLANT
SUT CHROMIC 0 CTX 36 (SUTURE) ×9 IMPLANT
SUT MON AB 2-0 CT1 27 (SUTURE) ×3 IMPLANT
SUT PDS AB 0 CT1 27 (SUTURE) IMPLANT
SUT PLAIN 0 NONE (SUTURE) ×3 IMPLANT
SUT VIC AB 0 CT1 36 (SUTURE) IMPLANT
SUT VIC AB 4-0 KS 27 (SUTURE) IMPLANT
TOWEL OR 17X24 6PK STRL BLUE (TOWEL DISPOSABLE) ×3 IMPLANT
TRAY FOLEY BAG SILVER LF 14FR (SET/KITS/TRAYS/PACK) IMPLANT

## 2016-07-04 NOTE — Anesthesia Procedure Notes (Signed)
Spinal  Patient location during procedure: OR Start time: 07/04/2016 2:15 PM End time: 07/04/2016 2:18 PM Staffing Anesthesiologist: Chaney MallingHODIERNE, Lequisha Cammack Performed: anesthesiologist  Preanesthetic Checklist Completed: patient identified, surgical consent, pre-op evaluation, timeout performed, IV checked, risks and benefits discussed and monitors and equipment checked Spinal Block Patient position: sitting Prep: DuraPrep Patient monitoring: cardiac monitor, continuous pulse ox and blood pressure Approach: midline Location: L3-4 Injection technique: single-shot Needle Needle type: Pencan  Needle gauge: 24 G Needle length: 9 cm Assessment Sensory level: T10 Additional Notes Functioning IV was confirmed and monitors were applied. Sterile prep and drape, including hand hygiene and sterile gloves were used. The patient was positioned and the spine was prepped. The skin was anesthetized with lidocaine.  Free flow of clear CSF was obtained prior to injecting local anesthetic into the CSF.  The spinal needle aspirated freely following injection.  The needle was carefully withdrawn.  The patient tolerated the procedure well.

## 2016-07-04 NOTE — Transfer of Care (Signed)
Immediate Anesthesia Transfer of Care Note  Patient: Destiny BernhardtRobin A Pena  Procedure(s) Performed: Procedure(s): CESAREAN SECTION (N/A)  Patient Location: PACU  Anesthesia Type:Spinal  Level of Consciousness: awake, alert  and oriented  Airway & Oxygen Therapy: Patient Spontanous Breathing  Post-op Assessment: Report given to RN and Post -op Vital signs reviewed and stable  Post vital signs: Reviewed and stable  Last Vitals:  Vitals:   07/04/16 0851 07/04/16 1109  BP: (!) 119/99 127/84  Pulse: (!) 107 (!) 116  Resp:  16  Temp:      Last Pain:  Vitals:   07/04/16 1215  TempSrc:   PainSc: 4          Complications: No apparent anesthesia complications

## 2016-07-04 NOTE — MAU Provider Note (Signed)
History    Patient Destiny Pena is a 31 y.o.  G3P1011 8679w1d Here with complaints of sharp left sided-abdominal pain that started this morning at 6:30 am. She thought it was related to constipation but she passed a normal bowel movement this morning and the pain continuted.  She has a history of chornic hypertension and low fluid volume. She is being monitoring twice weekly for fluid levels. Her baby is breech and her csection is cshedule for 07-11-2016.   CSN: [id number]  Arrival date and time: 07/04/16 0807   First Provider Initiated Contact with Patient 07/04/16 [phone]      Chief Complaint  Patient presents with  . Abdominal Pain   Abdominal Pain  This is a new problem. The current episode started today. The onset quality is sudden. The problem occurs constantly. The problem has been gradually improving. The pain is located in the LUQ and periumbilical region. Pain scale: pain is 7 when she came to MAU; it wwas a 10 at home. The quality of the pain is sharp. The abdominal pain does not radiate. Pertinent negatives include no constipation, diarrhea, dysuria, nausea or vomiting. The pain is aggravated by certain positions. The pain is relieved by certain positions. She has tried nothing for the symptoms.    OB History    Gravida Para Term Preterm AB Living   3 2 1 1 1 1    SAB TAB Ectopic Multiple Live Births   0 0 0 0 1      Past Medical History:  Diagnosis Date  . Cleft lip   . Hypertension    gestational hypertension  . Molar pregnancy     Past Surgical History:  Procedure Laterality Date  . CHOLECYSTECTOMY N/A 11/22/2012   Procedure: LAPAROSCOPIC CHOLECYSTECTOMY WITH INTRAOPERATIVE CHOLANGIOGRAM;  Surgeon: Adolph Pollackodd J Rosenbower, MD;  Location: [facility] OR;  Service: General;  Laterality: N/A;  . CLEFT LIP REPAIR    . DILATION AND CURETTAGE OF UTERUS  2011  . WISDOM TOOTH EXTRACTION      Family History  Problem Relation Age of Onset  . Other Mother        varicose veins   . Hypothyroidism Mother   . Irritable bowel syndrome Mother   . Hypertension Mother   . Arthritis Mother   . Bursitis Mother   . Scoliosis Paternal Grandfather   . Cancer Paternal Grandfather        melanoma  . Hypertension Maternal Grandmother   . Hypertension Maternal Grandfather   . Seizures Other        nephew    Social History  Substance Use Topics  . Smoking status: Never Smoker  . Smokeless tobacco: Never Used  . Alcohol use Yes     Comment: occasionally     Allergies:  Allergies  Allergen Reactions  . Clarithromycin Hives and Itching    Biaxin  . Sulfur Rash    High fever     Prescriptions Prior to Admission  Medication Sig Dispense Refill Last Dose  . aspirin EC 81 MG tablet Take 81 mg by mouth daily.   06/07/2016 at Unknown time  . labetalol (NORMODYNE) 200 MG tablet Take 1 tablet (200 mg total) by mouth 2 (two) times daily. 60 tablet 1   . Prenatal Vit-Fe Fumarate-FA (PRENATAL MULTIVITAMIN) TABS tablet Take 1 tablet by mouth daily at 12 noon.   06/07/2016 at Unknown time    Review of Systems  Respiratory: Negative.   Cardiovascular: Negative.   Gastrointestinal: Positive  for abdominal pain. Negative for constipation, diarrhea, nausea and vomiting.  Genitourinary: Negative for dysuria.  Psychiatric/Behavioral: Negative.    Physical Exam   Blood pressure (!) 119/99, pulse (!) 107, temperature 98 F (36.7 C), temperature source Oral, resp. rate 18.  Physical Exam  Constitutional: She is oriented to person, place, and time. She appears well-developed.  HENT:  Head: Normocephalic.  Neck: Normal range of motion.  Respiratory: Effort normal.  GI: Soft. She exhibits no distension and no mass. There is tenderness. There is no rebound.  Tenderness from LUG to umbilicus.   Musculoskeletal: Normal range of motion.  Neurological: She is alert and oriented to person, place, and time.  Skin: Skin is warm and dry.    MAU Course  Procedures  MDM -[place] and  NST  Assessment and Plan  Dr. Langston Masker consulted. Dr. Langston Masker at bedside to assess patient; plan to admit patient for C-Section  Marylene Land 07/04/2016, 9:42 AM

## 2016-07-04 NOTE — Anesthesia Preprocedure Evaluation (Signed)
Anesthesia Evaluation  Patient identified by MRN, date of birth, ID band Patient awake    Reviewed: Allergy & Precautions, H&P , NPO status , Patient's Chart, lab work & pertinent test results  Airway Mallampati: II   Neck ROM: full    Dental   Pulmonary neg pulmonary ROS,    breath sounds clear to auscultation       Cardiovascular hypertension,  Rhythm:regular Rate:Normal     Neuro/Psych    GI/Hepatic   Endo/Other    Renal/GU      Musculoskeletal   Abdominal   Peds  Hematology   Anesthesia Other Findings   Reproductive/Obstetrics (+) Pregnancy                             Anesthesia Physical Anesthesia Plan  ASA: II  Anesthesia Plan: Spinal   Post-op Pain Management:    Induction: Intravenous  Airway Management Planned: Simple Face Mask  Additional Equipment:   Intra-op Plan:   Post-operative Plan:   Informed Consent: I have reviewed the patients History and Physical, chart, labs and discussed the procedure including the risks, benefits and alternatives for the proposed anesthesia with the patient or authorized representative who has indicated his/her understanding and acceptance.     Plan Discussed with: CRNA, Anesthesiologist and Surgeon  Anesthesia Plan Comments:         Anesthesia Quick Evaluation

## 2016-07-04 NOTE — Anesthesia Postprocedure Evaluation (Signed)
Anesthesia Post Note  Patient: Destiny Pena  Procedure(s) Performed: Procedure(s) (LRB): CESAREAN SECTION (N/A)  Patient location during evaluation: PACU Anesthesia Type: Spinal Level of consciousness: oriented and awake and alert Pain management: pain level controlled Vital Signs Assessment: post-procedure vital signs reviewed and stable Respiratory status: spontaneous breathing, respiratory function stable and patient connected to nasal cannula oxygen Cardiovascular status: blood pressure returned to baseline and stable Postop Assessment: no headache and no backache Anesthetic complications: no        Last Vitals:  Vitals:   07/04/16 1630 07/04/16 1644  BP: 100/62 112/68  Pulse: 74 74  Resp: 20 16  Temp:  36.7 C    Last Pain:  Vitals:   07/04/16 1644  TempSrc: Oral  PainSc:    Pain Goal:                 Roscoe Witts S

## 2016-07-04 NOTE — MAU Note (Signed)
c/o sharp lower L abdominal pain that started this AM around 0630; scheduled for c-section on 07-11-16 for breech and low AFI; also has PIH with this pregnancy;

## 2016-07-04 NOTE — Op Note (Signed)
[facility]hardt PROCEDURE DATE: 07/04/2016  PREOPERATIVE DIAGNOSIS: Intrauterine pregnancy at  [redacted]w[redacted]d weeks gestation, oligohydramnios and breech presentation, desire for sterility  POSTOPERATIVE DIAGNOSIS: The same  PROCEDURE: Primary Low Transverse Cesarean Section, Bilateral Tubal Ligation  SURGEON:  Dr. Mitchel Honour  INDICATIONS: Destiny Pena is a 31 y.o. [id number] at [redacted]w[redacted]d scheduled for cesarean section secondary to oligohydramnios (AFI 3) and breech presentation.  The risks of cesarean section discussed with the patient included but were not limited to: bleeding which may require transfusion or reoperation; infection which may require antibiotics; injury to bowel, bladder, ureters or other surrounding organs; injury to the fetus; need for additional procedures including hysterectomy in the event of a life-threatening hemorrhage; placental abnormalities wth subsequent pregnancies, incisional problems, thromboembolic phenomenon and other postoperative/anesthesia complications. The patient desires sterility and is informed of the risk of permanence, regret and failure/ectopic with BTL.  The patient concurred with the proposed plan, giving informed written consent for the procedure.    FINDINGS:  Viable female infant in breech presentation, APGARs pending:  Weight pending.  Clear amniotic fluid.  Intact placenta, three vessel cord.  Grossly normal uterus, ovaries and fallopian tubes. .   ANESTHESIA:  Spinal ESTIMATED BLOOD LOSS: 500 ml SPECIMENS: Placenta sent to pathology, bilateral tubal segments COMPLICATIONS: None immediate  PROCEDURE IN DETAIL:  The patient received intravenous antibiotics and had sequential compression devices applied to her lower extremities while in the preoperative area.  She was then taken to the operating room where spinal anesthesia was administered and was found to be adequate. She was then placed in a dorsal supine position with a leftward tilt, and prepped  and draped in a sterile manner.  A foley catheter was placed into her bladder and attached to constant gravity.  After an adequate timeout was performed, a Pfannenstiel skin incision was made with scalpel and carried through to the underlying layer of fascia. The fascia was incised in the midline and this incision was extended bilaterally using the Mayo scissors. Kocher clamps were applied to the superior aspect of the fascial incision and the underlying rectus muscles were dissected off bluntly. A similar process was carried out on the inferior aspect of the facial incision. The rectus muscles were separated in the midline bluntly and the peritoneum was entered bluntly.  Bladder flap was created sharply and developed bluntly.  Bladder blade was placed.  A transverse hysterotomy was made with a scalpel and extended bilaterally bluntly. The bladder blade was then removed. The infant was successfully delivered using typical breech maneuvers, and cord was clamped and cut and infant was handed over to awaiting neonatology team. Uterine massage was then administered and the placenta delivered intact with three-vessel cord. The uterus was cleared of clot and debris.  The hysterotomy was closed with 0 chromic.  A second imbricating suture of 0-chromic was used to reinforce the incision and aid in hemostasis.  The right fallopian tube was elevated and doubly tied with plain gut suture 3 cm from the cornual region.  The tubal knuckle was excised and hemostasis noted.  The contralateral tube was treated in the same fashion.  The uterus was returned to the abdomen.  The peritoneum and rectus muscles were noted to be hemostatic and were reapproximated using 3-0 monocryl in a running fashion.  The fascia was closed with 0-Vicryl in a running fashion with good restoration of anatomy.  The subcutaneus tissue was copiously irrigated.  The skin was closed with 4-0 Vicryl in a subcuticular  fashion.  Pt tolerated the procedure will.   All counts were correct x2.  Pt went to the recovery room in stable condition.

## 2016-07-04 NOTE — H&P (Signed)
ALYRICA Pena is a 31 y.o. female presenting for eval of LUQ abdominal pain.  The patient started having pain this AM; not relieved with BM.  She has been followed closely in the office the last few weeks for oligo and came in to MAU to make sure fetal status was reassuring.  Antepartum course complicated by [facility] with h/o Pre-eclampsia; taking labetalol and daily baby ASA.  Patient received BMZ 4/27 and 28.  Ultrasound in MAU today shows AFI 3; MFM recommends delivery.  Bedside u/s confirms breech presentation (error on formal u/s documenting cephalic).    OB History    Gravida Para Term Preterm AB Living   3 1 1  0 1 1   SAB TAB Ectopic Multiple Live Births   0 0 0 0 1     Past Medical History:  Diagnosis Date  . Cleft lip   . Hypertension    gestational hypertension  . Molar pregnancy    Past Surgical History:  Procedure Laterality Date  . CHOLECYSTECTOMY N/A 11/22/2012   Procedure: LAPAROSCOPIC CHOLECYSTECTOMY WITH INTRAOPERATIVE CHOLANGIOGRAM;  Surgeon: Adolph Pollack, MD;  Location: [facility] OR;  Service: General;  Laterality: N/A;  . CLEFT LIP REPAIR    . DILATION AND CURETTAGE OF UTERUS  2011  . WISDOM TOOTH EXTRACTION     Family History: family history includes Arthritis in her mother; Bursitis in her mother; Cancer in her paternal grandfather; Hypertension in her maternal grandfather, maternal grandmother, and mother; Hypothyroidism in her mother; Irritable bowel syndrome in her mother; Other in her mother; Scoliosis in her paternal grandfather; Seizures in her other. Social History:  reports that she has never smoked. She has never used smokeless tobacco. She reports that she drinks alcohol. She reports that she does not use drugs.     Maternal Diabetes: No Genetic Screening: Normal Maternal Ultrasounds/Referrals: Abnormal:  Findings:   Other: oligohydramnios Fetal Ultrasounds or other Referrals:  Referred to Materal Fetal Medicine  Maternal Substance Abuse:   No Significant Maternal Medications:  Meds include: Other: HCTZ earlier in pregnancy, Labetalol presently, daily baby ASA (last dose 2 weeks ago) Significant Maternal Lab Results:  None Other Comments:  None  ROS Maternal Medical History:  Contractions: Onset was 1 week or more ago.   Frequency: irregular.   Perceived severity is mild.    Fetal activity: Perceived fetal activity is normal.   Last perceived fetal movement was within the past hour.    Prenatal complications: PIH and oligohydramnios.   Prenatal Complications - Diabetes: none.    Dilation: 2 Effacement (%): 70 Station: -3 Exam by:: Morrison Old RN Blood pressure 127/84, pulse (!) 116, temperature 98 F (36.7 C), temperature source Oral, resp. rate 16. Maternal Exam:  Uterine Assessment: Contraction strength is mild.  Contraction frequency is irregular.   Abdomen: Patient reports no abdominal tenderness. Fundal height is c/w dates.   Estimated fetal weight is 6#4.   Fetal presentation: breech     Physical Exam  Constitutional: She is oriented to person, place, and time. She appears well-developed and well-nourished.  GI: Soft. There is no rebound and no guarding.  Neurological: She is alert and oriented to person, place, and time.  Skin: Skin is warm and dry.  Psychiatric: She has a normal mood and affect. Her behavior is normal.    Prenatal labs: ABO, Rh: --/--/O POS, O POS (04/17 1740) Antibody: NEG (04/17 1740) Rubella: Immune (10/16 0000) RPR: Nonreactive (10/16 0000)  HBsAg: Negative (10/16 0000)  HIV:  Non-reactive (10/16 0000)  GBS:     Assessment/Plan: 31yo G3P1011 at 6648w1d with oligohydramnios and breech presentation desiring sterility -Primary C/S with BTL -Patient is counseled re: risk of bleeding, infection, scarring and damage to surrounding structures.  Pt understands risk of permanence, regret and ectopic with BTL.  She desires BTL regardless of the outcome with this baby.  All questions  were answered and the patient wishes to proceed.  Hilliary Jock 07/04/2016, 1:39 PM

## 2016-07-05 ENCOUNTER — Encounter (HOSPITAL_COMMUNITY): Payer: Self-pay | Admitting: *Deleted

## 2016-07-05 LAB — CBC
HEMATOCRIT: 34.2 % — AB (ref 36.0–46.0)
HEMOGLOBIN: 11.4 g/dL — AB (ref 12.0–15.0)
MCH: 27.6 pg (ref 26.0–34.0)
MCHC: 33.3 g/dL (ref 30.0–36.0)
MCV: 82.8 fL (ref 78.0–100.0)
Platelets: 185 10*3/uL (ref 150–400)
RBC: 4.13 MIL/uL (ref 3.87–5.11)
RDW: 15 % (ref 11.5–15.5)
WBC: 13.1 10*3/uL — ABNORMAL HIGH (ref 4.0–10.5)

## 2016-07-05 NOTE — Progress Notes (Signed)
Subjective: Postpartum Day 1: Cesarean Delivery Patient reports incisional pain, tolerating PO and no problems voiding.    Objective: Vital signs in last 24 hours: Temp:  [97.5 F (36.4 C)-98.4 F (36.9 C)] 98.2 F (36.8 C) (05/15 0545) Pulse Rate:  [69-121] 73 (05/15 0545) Resp:  [15-20] 18 (05/15 0545) BP: (100-131)/(57-99) 102/59 (05/15 0545) SpO2:  [98 %-100 %] 98 % (05/15 0545) Weight:  [197 lb (89.4 kg)] 197 lb (89.4 kg) (05/14 1726)  Physical Exam:  General: alert and cooperative Lochia: appropriate Uterine Fundus: firm Incision: healing well, no significant drainage DVT Evaluation: No evidence of DVT seen on physical exam.   Recent Labs  07/04/16 1327 07/05/16 0605  HGB 13.3 11.4*  HCT 39.3 34.2*    Assessment/Plan: Status post Cesarean section. Doing well postoperatively.  Continue current care.  Destiny Pena 07/05/2016, 8:23 AM

## 2016-07-05 NOTE — Anesthesia Postprocedure Evaluation (Signed)
Anesthesia Post Note  Patient: Destiny BernhardtRobin A Pena  Procedure(s) Performed: Procedure(s) (LRB): CESAREAN SECTION (N/A)  Patient location during evaluation: Mother Baby Anesthesia Type: Spinal Level of consciousness: awake and alert and oriented Pain management: pain level controlled Vital Signs Assessment: post-procedure vital signs reviewed and stable Respiratory status: spontaneous breathing and nonlabored ventilation Cardiovascular status: stable Postop Assessment: no headache, no backache, spinal receding, patient able to bend at knees, no signs of nausea or vomiting and adequate PO intake Anesthetic complications: no        Last Vitals:  Vitals:   07/05/16 0045 07/05/16 0545  BP: 107/63 (!) 102/59  Pulse: 69 73  Resp: 18 18  Temp: 36.7 C 36.8 C    Last Pain:  Vitals:   07/05/16 0725  TempSrc:   PainSc: 0-No pain   Pain Goal:                 Land O'LakesMalinova,Oneal Biglow Hristova

## 2016-07-06 MED ORDER — DOCUSATE SODIUM 100 MG PO CAPS
100.0000 mg | ORAL_CAPSULE | Freq: Two times a day (BID) | ORAL | 2 refills | Status: AC
Start: 2016-07-06 — End: ?

## 2016-07-06 MED ORDER — IBUPROFEN 600 MG PO TABS: 600.0000 mg | ORAL_TABLET | Freq: Four times a day (QID) | ORAL | 0 refills | Status: AC | PRN

## 2016-07-06 MED ORDER — FERROUS SULFATE 325 (65 FE) MG PO TBEC: 325.0000 mg | DELAYED_RELEASE_TABLET | Freq: Two times a day (BID) | ORAL | 2 refills | Status: AC

## 2016-07-06 MED ORDER — OXYCODONE-ACETAMINOPHEN 5-325 MG PO TABS: 1.0000 | ORAL_TABLET | Freq: Four times a day (QID) | ORAL | 0 refills | Status: DC | PRN

## 2016-07-06 NOTE — Discharge Summary (Signed)
Obstetric Discharge Summary Reason for Admission: cesarean section and oligo Prenatal Procedures: none Intrapartum Procedures: cesarean: low cervical, transverse and tubal ligation Postpartum Procedures: none Complications-Operative and Postpartum: none Hemoglobin  Date Value Ref Range Status  07/05/2016 11.4 (L) 12.0 - 15.0 g/dL Final   HCT  Date Value Ref Range Status  07/05/2016 34.2 (L) 36.0 - 46.0 % Final    Physical Exam:  General: alert, cooperative and appears stated age 77Lochia: appropriate Uterine Fundus: firm Incision: healing well, no significant drainage, no dehiscence, no significant erythema DVT Evaluation: No evidence of DVT seen on physical exam. Negative Homan's sign. No cords or calf tenderness. No significant calf/ankle edema.  Discharge Diagnoses: 36.1wga, delivered vis CS for oligo and breech  Discharge Information: Date: 07/06/2016 Activity: pelvic rest Diet: routine Medications: Ibuprofen, Colace, Iron and Percocet Condition: stable Instructions: refer to practice specific booklet Discharge to: home   Newborn Data: Live born female  Birth Weight: 5 lb 15.4 oz (2705 g) APGAR: 9, 9  Home with mother.  Ranae Pilalise Jennifer Nayab Aten 07/06/2016, 9:23 AM

## 2016-07-06 NOTE — Progress Notes (Signed)
Teaching complete 

## 2016-07-08 ENCOUNTER — Encounter (HOSPITAL_COMMUNITY): Admission: RE | Admit: 2016-07-08 | Payer: 59 | Source: Ambulatory Visit

## 2016-07-11 ENCOUNTER — Inpatient Hospital Stay (HOSPITAL_COMMUNITY): Admission: RE | Admit: 2016-07-11 | Payer: 59 | Source: Ambulatory Visit | Admitting: Obstetrics and Gynecology

## 2016-07-11 SURGERY — Surgical Case
Anesthesia: Regional

## 2016-07-15 ENCOUNTER — Emergency Department (HOSPITAL_COMMUNITY)
Admission: EM | Admit: 2016-07-15 | Discharge: 2016-07-15 | Disposition: A | Discharge status: Home / Self Care | Payer: 59 | Attending: Emergency Medicine | Admitting: Emergency Medicine

## 2016-07-15 ENCOUNTER — Emergency Department (HOSPITAL_COMMUNITY): Payer: 59

## 2016-07-15 ENCOUNTER — Encounter (HOSPITAL_COMMUNITY): Payer: Self-pay | Admitting: Emergency Medicine

## 2016-07-15 DIAGNOSIS — R1011 Right upper quadrant pain: Secondary | ICD-10-CM | POA: Insufficient documentation

## 2016-07-15 DIAGNOSIS — O9989 Other specified diseases and conditions complicating pregnancy, childbirth and the puerperium: Secondary | ICD-10-CM | POA: Insufficient documentation

## 2016-07-15 DIAGNOSIS — R918 Other nonspecific abnormal finding of lung field: Secondary | ICD-10-CM | POA: Insufficient documentation

## 2016-07-15 DIAGNOSIS — I10 Essential (primary) hypertension: Secondary | ICD-10-CM | POA: Insufficient documentation

## 2016-07-15 DIAGNOSIS — R1010 Upper abdominal pain, unspecified: Secondary | ICD-10-CM

## 2016-07-15 DIAGNOSIS — R52 Pain, unspecified: Secondary | ICD-10-CM

## 2016-07-15 LAB — CBC WITH DIFFERENTIAL/PLATELET
BASOS ABS: 0 10*3/uL (ref 0.0–0.1)
Basophils Relative: 0 %
EOS PCT: 1 %
Eosinophils Absolute: 0.1 10*3/uL (ref 0.0–0.7)
HEMATOCRIT: 39.8 % (ref 36.0–46.0)
Hemoglobin: 13.2 g/dL (ref 12.0–15.0)
Lymphocytes Relative: 28 %
Lymphs Abs: 2.6 10*3/uL (ref 0.7–4.0)
MCH: 27.4 pg (ref 26.0–34.0)
MCHC: 33.2 g/dL (ref 30.0–36.0)
MCV: 82.7 fL (ref 78.0–100.0)
MONO ABS: 0.5 10*3/uL (ref 0.1–1.0)
MONOS PCT: 6 %
Neutro Abs: 6.2 10*3/uL (ref 1.7–7.7)
Neutrophils Relative %: 65 %
PLATELETS: 326 10*3/uL (ref 150–400)
RBC: 4.81 MIL/uL (ref 3.87–5.11)
RDW: 14.1 % (ref 11.5–15.5)
WBC: 9.4 10*3/uL (ref 4.0–10.5)

## 2016-07-15 LAB — D-DIMER, QUANTITATIVE: D-Dimer, Quant: 1.73 ug/mL-FEU — ABNORMAL HIGH (ref 0.00–0.50)

## 2016-07-15 LAB — COMPREHENSIVE METABOLIC PANEL
ALT: 21 U/L (ref 14–54)
AST: 18 U/L (ref 15–41)
Albumin: 3.6 g/dL (ref 3.5–5.0)
Alkaline Phosphatase: 76 U/L (ref 38–126)
Anion gap: 9 (ref 5–15)
BUN: 8 mg/dL (ref 6–20)
CO2: 21 mmol/L — AB (ref 22–32)
Calcium: 9 mg/dL (ref 8.9–10.3)
Chloride: 109 mmol/L (ref 101–111)
Creatinine, Ser: 0.69 mg/dL (ref 0.44–1.00)
GFR calc Af Amer: >60 mL/min (ref >60)
GFR calc non Af Amer: >60 mL/min (ref >60)
Glucose, Bld: 88 mg/dL (ref 65–99)
Potassium: 3.6 mmol/L (ref 3.5–5.1)
Sodium: 139 mmol/L (ref 135–145)
Total Bilirubin: 0.2 mg/dL — ABNORMAL LOW (ref 0.3–1.2)
Total Protein: 7.4 g/dL (ref 6.5–8.1)

## 2016-07-15 LAB — URINALYSIS, ROUTINE W REFLEX MICROSCOPIC
Bilirubin Urine: NEGATIVE
GLUCOSE, UA: NEGATIVE mg/dL
Hgb urine dipstick: NEGATIVE
Ketones, ur: NEGATIVE mg/dL
LEUKOCYTES UA: NEGATIVE
Nitrite: NEGATIVE
PH: 6 (ref 5.0–8.0)
Protein, ur: NEGATIVE mg/dL
SPECIFIC GRAVITY, URINE: 1.015 (ref 1.005–1.030)

## 2016-07-15 LAB — LIPASE, BLOOD: Lipase: 23 U/L (ref 11–51)

## 2016-07-15 MED ORDER — FENTANYL CITRATE (PF) 100 MCG/2ML IJ SOLN
25.0000 ug | Freq: Once | INTRAMUSCULAR | Status: AC
  Administered 2016-07-15: 25 ug via INTRAVENOUS
  Filled 2016-07-15: qty 2

## 2016-07-15 MED ORDER — ONDANSETRON HCL 4 MG/2ML IJ SOLN
4.0000 mg | Freq: Once | INTRAMUSCULAR | Status: AC
  Administered 2016-07-15: 4 mg via INTRAVENOUS
  Filled 2016-07-15: qty 2

## 2016-07-15 MED ORDER — IOPAMIDOL (ISOVUE-370) INJECTION 76%
100.0000 mL | Freq: Once | INTRAVENOUS | Status: AC | PRN
  Administered 2016-07-15: 100 mL via INTRAVENOUS

## 2016-07-15 NOTE — ED Provider Notes (Signed)
I reexamined the patient. She is tender at the intersection of the right upper and right lower quadrant.  She does have an appetite. Will obtain CT of abdomen/ pelvis to rule out appendicitis.   Donnetta [place]ching[place], Jarrick Fjeld, MD 07/15/16 [phone]

## 2016-07-15 NOTE — ED Provider Notes (Signed)
CT angio chest and CT abdomen/pelvis show no evidence of pulmonary embolism or appendicitis. Patient will be discharged home.   Donnetta [place]ching[place], Tyrik Stetzer, MD 07/15/16 [id number]

## 2016-07-15 NOTE — ED Provider Notes (Signed)
[facility]-EMERG[facility]Y DEPT Provider Note   CSN: [id number] Arrival date & time: 07/15/16  1219  By signing my name below, I, Destiny Pena, attest that this documentation has been prepared under the direction and in the presence of physician practitioner, Bethann Berkshire, MD. Electronically Signed: Linna Pena, Scribe. 07/15/2016. 1:27 PM.  History   Chief Complaint Chief Complaint  Patient presents with  . Abdominal Pain   The history is provided by the patient. No language interpreter was used.  Abdominal Pain   This is a new problem. The current episode started yesterday. The problem occurs constantly. The problem has been gradually worsening. The pain is associated with an unknown factor. The pain is located in the RUQ. The pain is moderate. Associated symptoms include diarrhea. Pertinent negatives include fever, nausea, vomiting, frequency, hematuria and headaches. The symptoms are aggravated by deep breathing and palpation. Nothing relieves the symptoms. Past workup includes ultrasound and surgery. Her past medical history does not include PUD, gallstones, GERD, ulcerative colitis, Crohn's disease or irritable bowel syndrome.    HPI Comments: Destiny Pena is a 31 y.o. female with PMHx including HTN who presents to the Emergency Department complaining of persistent, gradually worsening RUQ pain beginning last night. She notes she developed some diarrhea this morning. Patient reports her abdominal pain is worse with applied pressure to her RUQ and deep inhalation. No alleviating factors noted. She is 11 days s/p C-section and states her current abdominal pain is different from that which she has experienced post-op. In addition to her C-section she has a pertinent PSHx of cholecystectomy. Patient denies nausea, vomiting, fevers, chills, or any other associated symptoms.  Past Medical History:  Diagnosis Date  . Cleft lip   . Hypertension    gestational hypertension  . Molar pregnancy       Patient Active Problem List   Diagnosis Date Noted  . Oligohydramnios 06/07/2016  . Essential hypertension, benign 03/03/2016  . Tachycardia 03/03/2016  . [redacted] weeks gestation of pregnancy 03/03/2016  . Hx of dysmenorrhea 04/09/2012  . Hx of menorrhagia 04/09/2012  . History of kidney stones 04/09/2012    Past Surgical History:  Procedure Laterality Date  . CESAREAN SECTION N/A 07/04/2016   Procedure: CESAREAN SECTION;  Surgeon: Mitchel Honour, DO;  Location: [facility];  Service: Obstetrics;  Laterality: N/A;  . CHOLECYSTECTOMY N/A 11/22/2012   Procedure: LAPAROSCOPIC CHOLECYSTECTOMY WITH INTRAOPERATIVE CHOLANGIOGRAM;  Surgeon: Adolph Pollack, MD;  Location: [facility];  Service: General;  Laterality: N/A;  . CLEFT LIP REPAIR    . DILATION AND CURETTAGE OF UTERUS  2011  . WISDOM TOOTH EXTRACTION      OB History    Gravida Para Term Preterm AB Living   3 2 1 1 1 1    SAB TAB Ectopic Multiple Live Births   0 0 0 0 1       Home Medications    Prior to Admission medications   Medication Sig Start Date End Date Taking? Authorizing Provider  docusate sodium (COLACE) 100 MG capsule Take 1 capsule (100 mg total) by mouth 2 (two) times daily. 07/06/16  Yes Ranae Pila, MD  ferrous sulfate 325 (65 FE) MG EC tablet Take 1 tablet (325 mg total) by mouth 2 (two) times daily. 07/06/16  Yes Ranae Pila, MD  ibuprofen (ADVIL,MOTRIN) 600 MG tablet Take 1 tablet (600 mg total) by mouth every 6 (six) hours as needed. 07/06/16  Yes Ranae Pila, MD  oxyCODONE-acetaminophen (PERCOCET/ROXICET) 5-325 MG  tablet Take 1 tablet by mouth every 6 (six) hours as needed for severe pain. 07/06/16   Ranae Pila, MD    Family History Family History  Problem Relation Age of Onset  . Other Mother        varicose veins  . Hypothyroidism Mother   . Irritable bowel syndrome Mother   . Hypertension Mother   . Arthritis Mother   . Bursitis Mother   . Scoliosis  Paternal Grandfather   . Cancer Paternal Grandfather        melanoma  . Hypertension Maternal Grandmother   . Hypertension Maternal Grandfather   . Seizures Other        nephew    Social History Social History  Substance Use Topics  . Smoking status: Never Smoker  . Smokeless tobacco: Never Used  . Alcohol use Yes     Comment: occasionally      Allergies   Clarithromycin and Sulfa antibiotics   Review of Systems Review of Systems  Constitutional: Negative for appetite change, chills, fatigue and fever.  HENT: Negative for congestion, ear discharge and sinus pressure.   Eyes: Negative for discharge.  Respiratory: Negative for cough.   Cardiovascular: Negative for chest pain.  Gastrointestinal: Positive for abdominal pain and diarrhea. Negative for nausea and vomiting.  Genitourinary: Negative for frequency and hematuria.  Musculoskeletal: Negative for back pain.  Skin: Negative for rash.  Neurological: Negative for seizures and headaches.  Psychiatric/Behavioral: Negative for hallucinations.   Physical Exam Updated Vital Signs BP (!) 141/103 (BP Location: Right Arm)   Temp 98.7 F (37.1 C) (Oral)   Ht 5\' 4"  (1.626 m)   Wt 195 lb (88.5 kg)   SpO2 99%   BMI 33.47 kg/m   Physical Exam  Constitutional: She is oriented to person, place, and time. She appears well-developed.  HENT:  Head: Normocephalic.  Eyes: Conjunctivae and EOM are normal. No scleral icterus.  Neck: Neck supple. No thyromegaly present.  Cardiovascular: Normal rate and regular rhythm.  Exam reveals no gallop and no friction rub.   No murmur heard. Pulmonary/Chest: No stridor. She has no wheezes. She has no rales. She exhibits no tenderness.  Abdominal: She exhibits no distension. There is tenderness. There is no rebound.  Moderate RUQ tenderness. Healing suprapubic incision.  Musculoskeletal: Normal range of motion. She exhibits no edema.  Lymphadenopathy:    She has no cervical adenopathy.    Neurological: She is oriented to person, place, and time. She exhibits normal muscle tone. Coordination normal.  Skin: No rash noted. No erythema.  Psychiatric: She has a normal mood and affect. Her behavior is normal.   ED Treatments / Results  Labs (all labs ordered are listed, but only abnormal results are displayed) Labs Reviewed - No data to display  EKG  EKG Interpretation None       Radiology No results found.  Procedures Procedures (including critical care time)  DIAGNOSTIC STUDIES: Oxygen Saturation is 99% on RA, normal by my interpretation.    COORDINATION OF CARE: 1:27 PM Discussed treatment plan with pt at bedside and pt agreed to plan.  Medications Ordered in ED Medications - No data to display   Initial Impression / Assessment and Plan / ED Course  I have reviewed the triage vital signs and the nursing notes.  Pertinent labs & imaging results that were available during my care of the patient were reviewed by me and considered in my medical decision making (see chart for details).  Patient with right upper quadrant abdominal pain worse with inspiration. She will get a CT angiogram for chest and epigastric negative possible CT of the abdomen.  Final Clinical Impressions(s) / ED Diagnoses   Final diagnoses:  None    New Prescriptions New Prescriptions   No medications on file  The chart was scribed for me under my direct supervision.  I personally performed the history, physical, and medical decision making and all procedures in the evaluation of this patient.Bethann Berkshire.    Rafaelita Foister, MD 07/15/16 [phone]

## 2016-07-15 NOTE — Discharge Instructions (Signed)
CT scan of chest and abdomen have showed no blood clot or evidence of appendicitis. Return for any concerns or follow-up with your primary care doctor.

## 2016-07-15 NOTE — ED Triage Notes (Signed)
Patient complaining of lower right quadrant abdominal pain since last night. Also complaining of diarrhea, denies vomiting. States she had c-section 11 days ago.

## 2019-02-20 ENCOUNTER — Ambulatory Visit: Payer: BC Managed Care – PPO | Attending: Internal Medicine

## 2019-02-20 ENCOUNTER — Other Ambulatory Visit: Payer: Self-pay

## 2019-02-20 DIAGNOSIS — Z20828 Contact with and (suspected) exposure to other viral communicable diseases: Secondary | ICD-10-CM | POA: Insufficient documentation

## 2019-02-20 DIAGNOSIS — Z20822 Contact with and (suspected) exposure to covid-19: Secondary | ICD-10-CM

## 2019-02-21 LAB — NOVEL CORONAVIRUS, NAA: SARS-CoV-2, NAA: NOT DETECTED

## 2019-03-07 ENCOUNTER — Other Ambulatory Visit: Payer: Self-pay

## 2019-03-07 ENCOUNTER — Ambulatory Visit: Payer: BC Managed Care – PPO | Attending: Internal Medicine

## 2019-03-07 DIAGNOSIS — Z20822 Contact with and (suspected) exposure to covid-19: Secondary | ICD-10-CM | POA: Insufficient documentation

## 2019-03-08 LAB — NOVEL CORONAVIRUS, NAA: SARS-CoV-2, NAA: NOT DETECTED

## 2019-03-18 ENCOUNTER — Other Ambulatory Visit: Payer: Self-pay

## 2019-03-18 ENCOUNTER — Ambulatory Visit: Payer: BC Managed Care – PPO | Attending: Internal Medicine

## 2019-03-18 DIAGNOSIS — Z20822 Contact with and (suspected) exposure to covid-19: Secondary | ICD-10-CM | POA: Insufficient documentation

## 2019-03-19 LAB — NOVEL CORONAVIRUS, NAA: SARS-CoV-2, NAA: NOT DETECTED

## 2019-11-21 ENCOUNTER — Ambulatory Visit: Payer: Self-pay | Attending: Internal Medicine

## 2019-11-21 DIAGNOSIS — Z23 Encounter for immunization: Secondary | ICD-10-CM

## 2019-11-21 NOTE — Progress Notes (Signed)
   Covid-19 Vaccination Clinic  Name:  Destiny Pena    MRN: [id number] DOB: 05-12-1985  11/21/2019  Destiny Pena was observed post Covid-19 immunization for 30 minutes based on pre-vaccination screening without incident. She was provided with Vaccine Information Sheet and instruction to access the V-Safe system.   Destiny Pena was instructed to call 911 with any severe reactions post vaccine: Marland Kitchen Difficulty breathing  . Swelling of face and throat  . A fast heartbeat  . A bad rash all over body  . Dizziness and weakness   Immunizations Administered    Name Date Dose VIS Date Route   Pfizer COVID-19 Vaccine 11/21/2019  9:00 AM 0.3 mL 04/17/2018 Intramuscular   Manufacturer: [employer], [employer]   Lot: [id number]   NDC: [address]-8

## 2019-12-31 ENCOUNTER — Encounter: Payer: Self-pay | Admitting: Physician Assistant

## 2019-12-31 ENCOUNTER — Telehealth: Payer: Self-pay | Admitting: Adult Health

## 2019-12-31 ENCOUNTER — Telehealth: Payer: Self-pay | Admitting: Physician Assistant

## 2019-12-31 DIAGNOSIS — Z6833 Body mass index (BMI) 33.0-33.9, adult: Secondary | ICD-10-CM | POA: Insufficient documentation

## 2019-12-31 DIAGNOSIS — I10 Essential (primary) hypertension: Secondary | ICD-10-CM | POA: Insufficient documentation

## 2019-12-31 NOTE — Telephone Encounter (Signed)
Called todiscuss with Destiny Pena about Covid symptoms and the use of sotrovimab, casirivimab/imdevimab or bamlanivimab/etesevimab infusion for those with mild to moderate Covid symptoms and at a high risk of hospitalization.   Pt is qualified for this infusion at the monoclonal antibody infusion center due to co-morbid conditions and/or a member of an at-risk group (BMI>25, HTN), however would like to think more about the infusion at this time. Symptoms tier reviewed as well as criteria for ending isolation.  Symptoms reviewed that would warrant ED/Hospital evaluation. Preventative practices reviewed. Patient verbalized understanding. Patient advised to call back if he decides that he does want to get infusion. Callback number to the infusion center given. Patient advised to go to Urgent care or ED with severe symptoms. Last date pt would be eligible for infusion is 11/15 .   Pt is trying to secure child care. She is interested in infusion and will call us back if she can find a time to come tomorrow.   [name] PA-C  [facility]

## 2019-12-31 NOTE — Telephone Encounter (Signed)
Called and LMOM regarding monoclonal antibody treatment for COVID 19 given to those who are at risk for complications and/or hospitalization of the virus.  Patient meets criteria based on: HTN  Call back number given: [phone]  My chart message: sent  Jimmey Hengel, NP  

## 2020-01-01 ENCOUNTER — Ambulatory Visit (HOSPITAL_COMMUNITY)
Admission: RE | Admit: 2020-01-01 | Discharge: 2020-01-01 | Disposition: A | Discharge status: Home / Self Care | Payer: HRSA Program | Source: Ambulatory Visit | Attending: Pulmonary Disease | Admitting: Pulmonary Disease

## 2020-01-01 ENCOUNTER — Other Ambulatory Visit (HOSPITAL_COMMUNITY): Payer: Self-pay | Admitting: Family

## 2020-01-01 DIAGNOSIS — U071 COVID-19: Secondary | ICD-10-CM | POA: Diagnosis present

## 2020-01-01 MED ORDER — SODIUM CHLORIDE 0.9 % IV SOLN: INTRAVENOUS | Status: DC | PRN

## 2020-01-01 MED ORDER — EPINEPHRINE 0.3 MG/0.3ML IJ SOAJ: 0.3000 mg | Freq: Once | INTRAMUSCULAR | Status: DC | PRN

## 2020-01-01 MED ORDER — METHYLPREDNISOLONE SODIUM SUCC 125 MG IJ SOLR: 125.0000 mg | Freq: Once | INTRAMUSCULAR | Status: DC | PRN

## 2020-01-01 MED ORDER — SOTROVIMAB 500 MG/8ML IV SOLN
500.0000 mg | Freq: Once | INTRAVENOUS | Status: AC
  Administered 2020-01-01: 500 mg via INTRAVENOUS

## 2020-01-01 MED ORDER — DIPHENHYDRAMINE HCL 50 MG/ML IJ SOLN: 50.0000 mg | Freq: Once | INTRAMUSCULAR | Status: DC | PRN

## 2020-01-01 MED ORDER — ALBUTEROL SULFATE HFA 108 (90 BASE) MCG/ACT IN AERS: 2.0000 | INHALATION_SPRAY | Freq: Once | RESPIRATORY_TRACT | Status: DC | PRN

## 2020-01-01 MED ORDER — FAMOTIDINE IN NACL 20-0.9 MG/50ML-% IV SOLN: 20.0000 mg | Freq: Once | INTRAVENOUS | Status: DC | PRN

## 2020-01-01 NOTE — Progress Notes (Signed)
I connected by phone with Gray Bernhardt on 01/01/2020 at 8:39 AM to discuss the potential use of a new treatment for mild to moderate COVID-19 viral infection in non-hospitalized patients.  This patient is a 35 y.o. female that meets the FDA criteria for Emergency Use Authorization of COVID monoclonal antibody casirivimab/imdevimab, bamlanivimab/eteseviamb, or sotrovimab.  Has a (+) direct SARS-CoV-2 viral test result  Has mild or moderate COVID-19   Is NOT hospitalized due to COVID-19  Is within 10 days of symptom onset  Has at least one of the high risk factor(s) for progression to severe COVID-19 and/or hospitalization as defined in EUA.  Specific high risk criteria : Cardiovascular disease or hypertension   Symptoms of sinus pressure, stuffy nose, cough, fevre, loss of taste began 12/27/19.   I have spoken and communicated the following to the patient or parent/caregiver regarding COVID monoclonal antibody treatment:  1. FDA has authorized the emergency use for the treatment of mild to moderate COVID-19 in adults and pediatric patients with positive results of direct SARS-CoV-2 viral testing who are 79 years of age and older weighing at least 40 kg, and who are at high risk for progressing to severe COVID-19 and/or hospitalization.  2. The significant known and potential risks and benefits of COVID monoclonal antibody, and the extent to which such potential risks and benefits are unknown.  3. Information on available alternative treatments and the risks and benefits of those alternatives, including clinical trials.  4. Patients treated with COVID monoclonal antibody should continue to self-isolate and use infection control measures (e.g., wear mask, isolate, social distance, avoid sharing personal items, clean and disinfect "high touch" surfaces, and frequent handwashing) according to CDC guidelines.   5. The patient or parent/caregiver has the option to accept or refuse COVID  monoclonal antibody treatment.  After reviewing this information with the patient, the patient has agreed to receive one of the available covid 19 monoclonal antibodies and will be provided an appropriate fact sheet prior to infusion. Morton Stall, NP 01/01/2020 8:39 AM

## 2020-01-01 NOTE — Discharge Instructions (Signed)

## 2020-01-01 NOTE — Progress Notes (Signed)
°  Diagnosis: COVID-19 ° °Physician: Patrick Wright, MD ° °Procedure: Sotrovimab Infusion  ° °Complications: No immediate complications noted. ° °Discharge: Discharged home  ° °Mykel Sponaugle N [place] °01/01/2020 ° ° °

## 2020-12-17 ENCOUNTER — Encounter: Payer: BC Managed Care – PPO | Admitting: Obstetrics & Gynecology

## 2020-12-30 ENCOUNTER — Telehealth: Payer: BC Managed Care – PPO | Admitting: Physician Assistant

## 2020-12-30 DIAGNOSIS — J208 Acute bronchitis due to other specified organisms: Secondary | ICD-10-CM | POA: Diagnosis not present

## 2020-12-30 DIAGNOSIS — B999 Unspecified infectious disease: Secondary | ICD-10-CM

## 2020-12-30 DIAGNOSIS — B9689 Other specified bacterial agents as the cause of diseases classified elsewhere: Secondary | ICD-10-CM | POA: Diagnosis not present

## 2020-12-30 MED ORDER — DOXYCYCLINE HYCLATE 100 MG PO TABS
100.0000 mg | ORAL_TABLET | Freq: Two times a day (BID) | ORAL | 0 refills | Status: AC
Start: 2020-12-30 — End: ?

## 2020-12-30 MED ORDER — BENZONATATE 100 MG PO CAPS: 100.0000 mg | ORAL_CAPSULE | Freq: Three times a day (TID) | ORAL | 0 refills | Status: AC | PRN

## 2020-12-30 NOTE — Progress Notes (Signed)
I have spent 5 minutes in review of e-visit questionnaire, review and updating patient chart, medical decision making and response to patient.   Jarman Litton Cody Daveena Elmore, PA-C    

## 2020-12-30 NOTE — Progress Notes (Signed)

## 2021-01-26 ENCOUNTER — Other Ambulatory Visit: Payer: BC Managed Care – PPO | Admitting: Adult Health

## 2021-03-03 ENCOUNTER — Other Ambulatory Visit: Payer: Self-pay | Admitting: Adult Health

## 2022-01-16 ENCOUNTER — Encounter (HOSPITAL_COMMUNITY): Payer: Self-pay | Admitting: *Deleted

## 2022-01-16 ENCOUNTER — Emergency Department (HOSPITAL_COMMUNITY)
Admission: EM | Admit: 2022-01-16 | Discharge: 2022-01-16 | Disposition: A | Discharge status: Home / Self Care | Payer: BC Managed Care – PPO | Attending: Emergency Medicine | Admitting: Emergency Medicine

## 2022-01-16 ENCOUNTER — Emergency Department (HOSPITAL_COMMUNITY): Payer: BC Managed Care – PPO

## 2022-01-16 ENCOUNTER — Other Ambulatory Visit: Payer: Self-pay

## 2022-01-16 DIAGNOSIS — R202 Paresthesia of skin: Secondary | ICD-10-CM | POA: Diagnosis not present

## 2022-01-16 DIAGNOSIS — I10 Essential (primary) hypertension: Secondary | ICD-10-CM | POA: Diagnosis not present

## 2022-01-16 DIAGNOSIS — R200 Anesthesia of skin: Secondary | ICD-10-CM | POA: Insufficient documentation

## 2022-01-16 DIAGNOSIS — Z79899 Other long term (current) drug therapy: Secondary | ICD-10-CM | POA: Insufficient documentation

## 2022-01-16 DIAGNOSIS — F419 Anxiety disorder, unspecified: Secondary | ICD-10-CM | POA: Diagnosis not present

## 2022-01-16 LAB — CBC WITH DIFFERENTIAL/PLATELET
Abs Immature Granulocytes: 0.02 10*3/uL (ref 0.00–0.07)
Basophils Absolute: 0 10*3/uL (ref 0.0–0.1)
Basophils Relative: 0 %
Eosinophils Absolute: 0 10*3/uL (ref 0.0–0.5)
Eosinophils Relative: 0 %
HCT: 42.7 % (ref 36.0–46.0)
Hemoglobin: 13.8 g/dL (ref 12.0–15.0)
Immature Granulocytes: 0 %
Lymphocytes Relative: 16 %
Lymphs Abs: 1.3 10*3/uL (ref 0.7–4.0)
MCH: 27.5 pg (ref 26.0–34.0)
MCHC: 32.3 g/dL (ref 30.0–36.0)
MCV: 85.2 fL (ref 80.0–100.0)
Monocytes Absolute: 0.5 10*3/uL (ref 0.1–1.0)
Monocytes Relative: 6 %
Neutro Abs: 6.3 10*3/uL (ref 1.7–7.7)
Neutrophils Relative %: 78 %
Platelets: 272 10*3/uL (ref 150–400)
RBC: 5.01 MIL/uL (ref 3.87–5.11)
RDW: 13 % (ref 11.5–15.5)
WBC: 8.2 10*3/uL (ref 4.0–10.5)
nRBC: 0 % (ref 0.0–0.2)

## 2022-01-16 LAB — BASIC METABOLIC PANEL
Anion gap: 9 (ref 5–15)
BUN: 11 mg/dL (ref 6–20)
CO2: 23 mmol/L (ref 22–32)
Calcium: 8.7 mg/dL — ABNORMAL LOW (ref 8.9–10.3)
Chloride: 106 mmol/L (ref 98–111)
Creatinine, Ser: 0.91 mg/dL (ref 0.44–1.00)
GFR, Estimated: >60 mL/min (ref >60)
Glucose, Bld: 97 mg/dL (ref 70–99)
Potassium: 4.1 mmol/L (ref 3.5–5.1)
Sodium: 138 mmol/L (ref 135–145)

## 2022-01-16 LAB — PREGNANCY, URINE: Preg Test, Ur: NEGATIVE

## 2022-01-16 MED ORDER — HYDROCHLOROTHIAZIDE 25 MG PO TABS: 25.0000 mg | ORAL_TABLET | Freq: Every day | ORAL | 0 refills | Status: AC

## 2022-01-16 NOTE — Discharge Instructions (Addendum)
Take the medication prescribed for better control of your blood pressure.  As discussed, your lab tests and CT imaging are normal today.  I do recommend you obtaining a primary medical doctor, given you a couple suggestions above.  Call the neurology group listed for further evaluation of your tingling symptoms if they persist.

## 2022-01-16 NOTE — ED Notes (Signed)
Pt ambulatory to restroom independently.

## 2022-01-16 NOTE — ED Triage Notes (Signed)
Pt with left arm numbness since yesterday since 1030.  Pt with hx HTN.  Left leg with tingling since this morning. Pt is tearful in triage. Pt drove herself here.  Pt states left leg is weaker.

## 2022-01-18 NOTE — ED Provider Notes (Signed)
[facility] EMERGENCY DEPARTMENT Provider Note   CSN: [id number] Arrival date & time: 01/16/22  1145     History  Chief Complaint  Patient presents with   Numbness    Destiny Pena is a 36 y.o. female with a history of hypertension first diagnosed during her pregnancy with her now 62-year-old child presenting for evaluation of elevated blood pressure along with numbness.  She first noticed having numbness in her left arm yesterday morning which started around 1030, describing waxing and waning symptoms throughout the day, resolved then today noticed numbness and tingling in her left leg which started this morning.  She perceives weakness in her left leg but was able to drive herself here and has been ambulatory in the department.  She denies similar symptoms in the past.  She has had no injuries or falls, denies fevers or chills, recent illnesses.  Additionally she has had no headache, neck pain or stiffness, chest pain, palpitations.  She has been measuring her blood pressures at home and on average they have been around 145/95 range.  When she first arrived her initial blood pressure was 152/127 and she had a pulse rate of 136, however she was tearful and upset when she first arrived.  Her blood pressures since his initial measurement have been more consistent with the number she has been getting at home.  She has not been tachycardic since the initial measurement upon first arrival.     The history is provided by the patient.       Home Medications Prior to Admission medications   Medication Sig Start Date End Date Taking? Authorizing Provider  hydrochlorothiazide (HYDRODIURIL) 25 MG tablet Take 1 tablet (25 mg total) by mouth daily. 01/16/22  Yes Terrie Grajales, Almyra Free, PA-C  benzonatate (TESSALON) 100 MG capsule Take 1 capsule (100 mg total) by mouth 3 (three) times daily as needed for cough. 12/30/20   Brunetta Jeans, PA-C  docusate sodium (COLACE) 100 MG capsule Take 1 capsule (100 mg  total) by mouth 2 (two) times daily. 07/06/16   Tyson Dense, MD  doxycycline (VIBRA-TABS) 100 MG tablet Take 1 tablet (100 mg total) by mouth 2 (two) times daily. 12/30/20   Brunetta Jeans, PA-C  ferrous sulfate 325 (65 FE) MG EC tablet Take 1 tablet (325 mg total) by mouth 2 (two) times daily. 07/06/16   Tyson Dense, MD  ibuprofen (ADVIL,MOTRIN) 600 MG tablet Take 1 tablet (600 mg total) by mouth every 6 (six) hours as needed. 07/06/16   Tyson Dense, MD  oxyCODONE-acetaminophen (PERCOCET/ROXICET) 5-325 MG tablet Take 1 tablet by mouth every 6 (six) hours as needed for severe pain. 07/06/16   Tyson Dense, MD      Allergies    Clarithromycin and Sulfa antibiotics    Review of Systems   Review of Systems  Constitutional:  Negative for chills and fever.  HENT:  Negative for congestion and sore throat.   Eyes: Negative.  Negative for visual disturbance.  Respiratory:  Negative for chest tightness and shortness of breath.   Cardiovascular:  Negative for chest pain, palpitations and leg swelling.  Gastrointestinal:  Negative for abdominal pain, nausea and vomiting.  Genitourinary: Negative.   Musculoskeletal:  Negative for arthralgias, joint swelling and neck pain.  Skin: Negative.  Negative for rash and wound.  Neurological:  Positive for weakness and numbness. Negative for dizziness, light-headedness and headaches.  Psychiatric/Behavioral: Negative.      Physical Exam Updated Vital Signs BP Marland Kitchen)  132/93 (BP Location: Right Arm)   Pulse 96   Temp 99.5 F (37.5 C)   Resp 16   Ht _0  (1.626 m)   Wt 94.2 kg   LMP 01/07/2022   SpO2 100%   BMI 35.65 kg/m  Physical Exam Vitals and nursing note reviewed.  Constitutional:      Appearance: She is well-developed.     Comments: Anxious, intermittent brief tearfulness  HENT:     Head: Normocephalic and atraumatic.     Mouth/Throat:     Mouth: Mucous membranes are moist.  Eyes:     General: No  visual field deficit.    Extraocular Movements: Extraocular movements intact.     Conjunctiva/sclera: Conjunctivae normal.     Pupils: Pupils are equal, round, and reactive to light.  Cardiovascular:     Rate and Rhythm: Normal rate and regular rhythm.     Heart sounds: Normal heart sounds.  Pulmonary:     Effort: Pulmonary effort is normal.     Breath sounds: Normal breath sounds. No wheezing.  Abdominal:     General: Bowel sounds are normal.     Palpations: Abdomen is soft.     Tenderness: There is no abdominal tenderness.  Musculoskeletal:        General: Normal range of motion.     Cervical back: Normal range of motion and neck supple.  Lymphadenopathy:     Cervical: No cervical adenopathy.  Skin:    General: Skin is warm and dry.     Findings: No rash.  Neurological:     General: No focal deficit present.     Mental Status: She is alert and oriented to person, place, and time.     GCS: GCS eye subscore is 4. GCS verbal subscore is 5. GCS motor subscore is 6.     Cranial Nerves: Cranial nerves 2-12 are intact. No cranial nerve deficit or facial asymmetry.     Sensory: No sensory deficit.     Motor: Motor function is intact.     Coordination: Coordination normal. Heel to Shin Test normal.     Gait: Gait normal.     Deep Tendon Reflexes: Reflexes normal.     Comments: Normal heel-shin, normal rapid alternating movements. Cranial nerves III-XII intact.  No pronator drift.  Equal grip strength.  No foot drop.  Psychiatric:        Speech: Speech normal.        Behavior: Behavior normal.        Thought Content: Thought content normal.     ED Results / Procedures / Treatments   Labs (all labs ordered are listed, but only abnormal results are displayed) Labs Reviewed  BASIC METABOLIC PANEL - Abnormal; Notable for the following components:      Result Value   Calcium 8.7 (*)    All other components within normal limits  PREGNANCY, URINE  CBC WITH DIFFERENTIAL/PLATELET     EKG EKG Interpretation  Date/Time:  Sunday January 16 2022 11:56:03 EST Ventricular Rate:  134 PR Interval:  126 QRS Duration: 72 QT Interval:  292 QTC Calculation: 436 R Axis:   79 Text Interpretation: Sinus tachycardia Confirmed by Godfrey Pick ([id number]) on 01/16/2022 3:28:42 PM  Radiology No results found.  Procedures Procedures    Medications Ordered in ED Medications - No data to display  ED Course/ Medical Decision Making/ A&P  Medical Decision Making Patient presenting with waxing and waning left arm numbness yesterday, now today with left leg tingling and perceived weakness but patient has no neurologic deficits on exam.  She is ambulatory in the department.  She was initially very hypertensive here 152/127, during her stay however her blood pressures have greatly improved with her last blood pressure measurement 132/93.  She was very anxious when she first arrived.  She has been on hydrochlorothiazide in the past for her blood pressure, has not continued with this medication as she has not had a primary provider in several years.  Workup and exam today are reassuring, no evidence for endorgan damage including acute stroke.  Her exam is not consistent with this diagnosis.  She is going to be obtaining a local primary MD.  In the interim I have prescribed her HCTZ.  Patient is also being referred to neurology if her numbness symptoms persist, she was also given return precautions regarding any worsening symptoms.  Amount and/or Complexity of Data Reviewed Labs: ordered.    Details: Be met, CBC normal. Radiology: ordered.    Details: CT head is negative for acute findings. ECG/medicine tests: ordered and independent interpretation performed.    Details: Sinus tachycardia upon initial presentation.  Risk Prescription drug management.           Final Clinical Impression(s) / ED Diagnoses Final diagnoses:  Paresthesias  Hypertension,  unspecified type    Rx / DC Orders ED Discharge Orders          Ordered    hydrochlorothiazide (HYDRODIURIL) 25 MG tablet  Daily        01/16/22 1541              Evalee Jefferson, Hershal Coria 01/18/22 2209    Godfrey Pick, MD 01/25/22 1710

## 2023-12-17 ENCOUNTER — Encounter

## 2024-03-29 ENCOUNTER — Emergency Department (HOSPITAL_COMMUNITY)
Admission: EM | Admit: 2024-03-29 | Discharge: 2024-03-29 | Disposition: A | Discharge status: Home / Self Care | Payer: Self-pay | Source: Home / Self Care | Attending: Emergency Medicine | Admitting: Emergency Medicine

## 2024-03-29 ENCOUNTER — Emergency Department (HOSPITAL_COMMUNITY): Payer: Self-pay

## 2024-03-29 ENCOUNTER — Other Ambulatory Visit: Payer: Self-pay

## 2024-03-29 DIAGNOSIS — N201 Calculus of ureter: Secondary | ICD-10-CM

## 2024-03-29 LAB — COMPREHENSIVE METABOLIC PANEL WITH GFR
ALT: 21 U/L (ref 0–44)
AST: 24 U/L (ref 15–41)
Albumin: 4.2 g/dL (ref 3.5–5.0)
Alkaline Phosphatase: 51 U/L (ref 38–126)
Anion gap: 18 — ABNORMAL HIGH (ref 5–15)
BUN: 15 mg/dL (ref 6–20)
CO2: 20 mmol/L — ABNORMAL LOW (ref 22–32)
Calcium: 8.9 mg/dL (ref 8.9–10.3)
Chloride: 103 mmol/L (ref 98–111)
Creatinine, Ser: 0.87 mg/dL (ref 0.44–1.00)
GFR, Estimated: >60 mL/min
Glucose, Bld: 95 mg/dL (ref 70–99)
Potassium: 3.5 mmol/L (ref 3.5–5.1)
Sodium: 141 mmol/L (ref 135–145)
Total Bilirubin: 0.5 mg/dL (ref 0.0–1.2)
Total Protein: 7.2 g/dL (ref 6.5–8.1)

## 2024-03-29 LAB — URINALYSIS, ROUTINE W REFLEX MICROSCOPIC
Bilirubin Urine: NEGATIVE
Glucose, UA: NEGATIVE mg/dL
Ketones, ur: NEGATIVE mg/dL
Leukocytes,Ua: NEGATIVE
Nitrite: NEGATIVE
Protein, ur: 30 mg/dL — AB
RBC / HPF: >50 RBC/hpf (ref 0–5)
Specific Gravity, Urine: 1.025 (ref 1.005–1.030)
pH: 6 (ref 5.0–8.0)

## 2024-03-29 LAB — POC URINE PREG, ED: Preg Test, Ur: NEGATIVE

## 2024-03-29 LAB — CBC
HCT: 39.7 % (ref 36.0–46.0)
Hemoglobin: 13.3 g/dL (ref 12.0–15.0)
MCH: 27.8 pg (ref 26.0–34.0)
MCHC: 33.5 g/dL (ref 30.0–36.0)
MCV: 82.9 fL (ref 80.0–100.0)
Platelets: 293 10*3/uL (ref 150–400)
RBC: 4.79 MIL/uL (ref 3.87–5.11)
RDW: 13 % (ref 11.5–15.5)
WBC: 7.6 10*3/uL (ref 4.0–10.5)
nRBC: 0 % (ref 0.0–0.2)

## 2024-03-29 LAB — LIPASE, BLOOD: Lipase: 25 U/L (ref 11–51)

## 2024-03-29 LAB — HCG, SERUM, QUALITATIVE: Preg, Serum: NEGATIVE

## 2024-03-29 MED ORDER — MORPHINE SULFATE 15 MG PO TABS: 15.0000 mg | ORAL_TABLET | Freq: Four times a day (QID) | ORAL | 0 refills | Status: AC | PRN

## 2024-03-29 MED ORDER — SODIUM CHLORIDE 0.9 % IV BOLUS
1000.0000 mL | Freq: Once | INTRAVENOUS | Status: AC
  Administered 2024-03-29: 1000 mL via INTRAVENOUS

## 2024-03-29 MED ORDER — KETOROLAC TROMETHAMINE 15 MG/ML IJ SOLN
15.0000 mg | Freq: Once | INTRAMUSCULAR | Status: AC
  Administered 2024-03-29: 15 mg via INTRAVENOUS
  Filled 2024-03-29: qty 1

## 2024-03-29 MED ORDER — HYDROMORPHONE HCL 1 MG/ML IJ SOLN
0.5000 mg | Freq: Once | INTRAMUSCULAR | Status: AC
  Administered 2024-03-29: 0.5 mg via INTRAVENOUS
  Filled 2024-03-29: qty 0.5

## 2024-03-29 MED ORDER — ONDANSETRON HCL 4 MG/2ML IJ SOLN
4.0000 mg | Freq: Once | INTRAMUSCULAR | Status: AC | PRN
  Administered 2024-03-29: 4 mg via INTRAVENOUS
  Filled 2024-03-29: qty 2

## 2024-03-29 NOTE — ED Provider Notes (Signed)
 " [place] EMERGENCY DEPARTMENT AT [facility] Provider Note   CSN: [id number] Arrival date & time: 03/29/24  9179     Patient presents with: Abdominal Pain (LLQ)   Destiny Pena is a 39 y.o. female.   HPI 39 year old female presents with acute lower abdominal pain.  Symptoms started this morning.  The pain is just left of midline.  Does seem to radiate out towards her left flank but not into her back.  She has had some nausea but she thinks that is due to the degree of pain.  The pain has been progressively worsening.  At times it gets worse out of nowhere but is a constant pain in general.  No urinary symptoms such as dysuria.  She had a normal bowel movement this morning.  She has not had a fever or cough or shortness of breath.  She has not missed a menstrual cycle but at first thought it might be her menses coming on but the pain is way worse than it would normally be.  Prior to Admission medications  Medication Sig Start Date End Date Taking? Authorizing Provider  morphine  (MSIR) 15 MG tablet Take 1 tablet (15 mg total) by mouth every 6 (six) hours as needed for severe pain (pain score 7-10). 03/29/24  Yes Freddi Hamilton, MD  benzonatate  (TESSALON ) 100 MG capsule Take 1 capsule (100 mg total) by mouth 3 (three) times daily as needed for cough. 12/30/20   Gladis Elsie BROCKS, PA-C  docusate sodium  (COLACE) 100 MG capsule Take 1 capsule (100 mg total) by mouth 2 (two) times daily. 07/06/16   Marne Kelly Nest, MD  doxycycline  (VIBRA -TABS) 100 MG tablet Take 1 tablet (100 mg total) by mouth 2 (two) times daily. 12/30/20   Gladis Elsie BROCKS, PA-C  ferrous sulfate  325 (65 FE) MG EC tablet Take 1 tablet (325 mg total) by mouth 2 (two) times daily. 07/06/16   Marne Kelly Nest, MD  hydrochlorothiazide  (HYDRODIURIL ) 25 MG tablet Take 1 tablet (25 mg total) by mouth daily. 01/16/22   Idol, Julie, PA-C  ibuprofen  (ADVIL ,MOTRIN ) 600 MG tablet Take 1 tablet (600 mg total) by mouth  every 6 (six) hours as needed. 07/06/16   Marne Kelly Nest, MD    Allergies: Clarithromycin and Sulfa antibiotics    Review of Systems  Gastrointestinal:  Positive for abdominal pain and nausea. Negative for constipation, diarrhea and vomiting.  Genitourinary:  Negative for dysuria and menstrual problem.  Musculoskeletal:  Negative for back pain.    Updated Vital Signs BP 113/79 (BP Location: Left Arm)   Pulse 88   Temp 97.7 F (36.5 C) (Oral)   Resp 17   Ht 5' 4 (1.626 m)   Wt 88.9 kg   SpO2 100%   BMI 33.64 kg/m   Physical Exam Vitals and nursing note reviewed.  Constitutional:      Appearance: She is well-developed.     Comments: In severe pain.  HENT:     Head: Normocephalic and atraumatic.  Cardiovascular:     Rate and Rhythm: Normal rate and regular rhythm.     Heart sounds: Normal heart sounds.  Pulmonary:     Effort: Pulmonary effort is normal.     Breath sounds: Normal breath sounds.  Abdominal:     Palpations: Abdomen is soft.     Tenderness: There is no abdominal tenderness. There is no right CVA tenderness or left CVA tenderness.  Skin:    General: Skin is warm and dry.  Neurological:     Mental Status: She is alert.     (all labs ordered are listed, but only abnormal results are displayed) Labs Reviewed  COMPREHENSIVE METABOLIC PANEL WITH GFR - Abnormal; Notable for the following components:      Result Value   CO2 20 (*)    Anion gap 18 (*)    All other components within normal limits  URINALYSIS, ROUTINE W REFLEX MICROSCOPIC - Abnormal; Notable for the following components:   APPearance HAZY (*)    Hgb urine dipstick LARGE (*)    Protein, ur 30 (*)    Bacteria, UA FEW (*)    All other components within normal limits  LIPASE, BLOOD  CBC  HCG, SERUM, QUALITATIVE  POC URINE PREG, ED    EKG: None  Radiology: CT Renal Stone Study Result Date: 03/29/2024 EXAM: CT ABDOMEN AND PELVIS WITHOUT CONTRAST 03/29/2024 12:47:17 PM TECHNIQUE: CT  of the abdomen and pelvis was performed without the administration of intravenous contrast. Multiplanar reformatted images are provided for review. Automated exposure control, iterative reconstruction, and/or weight-based adjustment of the mA/kV was utilized to reduce the radiation dose to as low as reasonably achievable. COMPARISON: 07/15/2016 CLINICAL HISTORY: Abdominal/flank pain, stone suspected. FINDINGS: LOWER CHEST: PUBILINEAR scarring in the lung bases. LIVER: The liver is unremarkable. GALLBLADDER AND BILE DUCTS: Cholecystectomy. No biliary ductal dilatation. SPLEEN: No acute abnormality. PANCREAS: No acute abnormality. ADRENAL GLANDS: No acute abnormality. KIDNEYS, URETERS AND BLADDER: Small nonobstructive right interpolar region PALATINE calculus. Mild left-sided hydroureteronephrosis due to an obstructive calculus in the distal left ureter, which measures 3 x 2 x 3 mm. Partially distended urinary bladder, without focal abnormality. No perinephric or periureteral stranding. GI AND BOWEL: Stomach demonstrates no acute abnormality. A couple of scattered diverticula noted in the descending and sigmoid colon. Decompressed normal appendix. There is no bowel obstruction. PERITONEUM AND RETROPERITONEUM: No ascites. No free air. No free pelvic fluid. VASCULATURE: Aorta is normal in caliber. LYMPH NODES: No lymphadenopathy. REPRODUCTIVE ORGANS: The uterus and ovaries are within normal limits for patient's age. BONES AND SOFT TISSUES: Scattered thoracic osteophytosis. No acute osseous abnormality. No focal soft tissue abnormality. IMPRESSION: 1. Mild left-sided hydroureteronephrosis due to an obstructing distal left ureteral calculus measuring 3 x 2 x 3 mm. 2. Small nonobstructing right renal calculus. Electronically signed by: Rogelia Myers MD 03/29/2024 02:08 PM EST RP Workstation: [id number]   US  PELVIC COMPLETE W TRANSVAGINAL AND TORSION R/O Result Date: 03/29/2024 CLINICAL DATA:  Acute left-sided pelvic  pain EXAM: TRANSABDOMINAL AND TRANSVAGINAL ULTRASOUND OF PELVIS DOPPLER ULTRASOUND OF OVARIES TECHNIQUE: Both transabdominal and transvaginal ultrasound examinations of the pelvis were performed. Transabdominal technique was performed for global imaging of the pelvis including uterus, ovaries, adnexal regions, and pelvic cul-de-sac. It was necessary to proceed with endovaginal exam following the transabdominal exam to visualize the endometrium. Color and duplex Doppler ultrasound was utilized to evaluate blood flow to the ovaries. COMPARISON:  None Available. FINDINGS: Uterus Measurements: 8.9 x 5.8 x 4.7 cm = volume: 128 mL. No fibroids or other mass visualized. Endometrium Thickness: 12 mm which is within normal limits. No focal abnormality visualized. Right ovary Measurements: 3.6 x 2.9 x 2.5 cm = volume:  13 mL. Normal appearance/no adnexal mass. Doppler: There is normal vascularity on color doppler examination. Spectral doppler arterial and venous waveforms are normal. Left ovary Measurements: 2.8 x 3.2 x 2.1 cm = volume: 10 mL. Normal appearance/no adnexal mass. Doppler: There is normal vascularity on color doppler examination. Spectral doppler arterial  and venous waveforms are normal. Other findings Trace free fluid is noted which most likely is physiologic. IMPRESSION: No evidence of ovarian mass or torsion. No definite abnormality seen in the pelvis. Electronically Signed   By: Lynwood Landy Raddle M.D.   On: 03/29/2024 11:52     Procedures   Medications Ordered in the ED  ondansetron  (ZOFRAN ) injection 4 mg (4 mg Intravenous Given 03/29/24 0900)  HYDROmorphone  (DILAUDID ) injection 0.5 mg (0.5 mg Intravenous Given 03/29/24 0855)  sodium chloride  0.9 % bolus 1,000 mL (0 mLs Intravenous Stopped 03/29/24 1126)  ketorolac  (TORADOL ) 15 MG/ML injection 15 mg (15 mg Intravenous Given 03/29/24 1007)  HYDROmorphone  (DILAUDID ) injection 0.5 mg (0.5 mg Intravenous Given 03/29/24 1003)                                     Medical Decision Making Amount and/or Complexity of Data Reviewed Labs: ordered.    Details: Hematuria, normal WBC Radiology: ordered and independent interpretation performed.    Details: Left ureteral stone  Risk Prescription drug management.   Patient is in severe pain on arrival.  Due to this, was given Dilaudid  but still having a fair amount of pain.  Was rule out for torsion with Doppler ultrasound given degree of pain.  CT stone study does show a left ureteral stone which would explain her symptoms.  No UTI on urinalysis.  She has a mild anion gap acidosis of unclear etiology, was given fluids.  Does not appear to have any infection/septic stone.  After some Toradol  her pain is significantly better and she feels well enough for discharge home.  Based on allergies, will prescribe morphine  IR for breakthrough pain.  Recommend NSAIDs and follow-up with urology.  Will discharge home with return precautions.     Final diagnoses:  Left ureteral stone    ED Discharge Orders          Ordered    morphine  (MSIR) 15 MG tablet  Every 6 hours PRN        03/29/24 1447               Freddi Hamilton, MD 03/29/24 1505  "

## 2024-03-29 NOTE — Discharge Instructions (Addendum)
 Use ibuprofen  and Tylenol  to help with pain.  If this is not helping or you still have severe pain then you may use the morphine  tablets.  Follow-up with urology.  If you develop any new or worsening symptoms then return to the ER.

## 2024-03-29 NOTE — ED Triage Notes (Signed)
 Pt biba with complaints of LLQ abdominal pain that came on suddenly. Pt states she took apap when symptoms started and then it progressed from there. Pt reports not recent illness. Pt states her last menses was Jan 19 and she is due. Pt reports tubal in 2019. Pt states pain is 10/10 with nausea, no vomiting.
